# Patient Record
Sex: Female | Born: 1985 | Race: Black or African American | Hispanic: No | Marital: Single | Smoking: Never smoker
Health system: Southern US, Community
[De-identification: ages and names within clinical notes are randomized; demographics above are authoritative.]

## PROBLEM LIST (undated history)

## (undated) ENCOUNTER — Inpatient Hospital Stay: Payer: Self-pay

## (undated) DIAGNOSIS — I10 Essential (primary) hypertension: Secondary | ICD-10-CM

## (undated) DIAGNOSIS — Z9289 Personal history of other medical treatment: Secondary | ICD-10-CM

## (undated) DIAGNOSIS — O24419 Gestational diabetes mellitus in pregnancy, unspecified control: Secondary | ICD-10-CM

## (undated) DIAGNOSIS — N760 Acute vaginitis: Secondary | ICD-10-CM

## (undated) DIAGNOSIS — N915 Oligomenorrhea, unspecified: Secondary | ICD-10-CM

## (undated) DIAGNOSIS — Z789 Other specified health status: Secondary | ICD-10-CM

## (undated) DIAGNOSIS — N200 Calculus of kidney: Secondary | ICD-10-CM

## (undated) DIAGNOSIS — B9689 Other specified bacterial agents as the cause of diseases classified elsewhere: Secondary | ICD-10-CM

## (undated) HISTORY — DX: Personal history of other medical treatment: Z92.89

## (undated) HISTORY — DX: Acute vaginitis: N76.0

## (undated) HISTORY — PX: WISDOM TOOTH EXTRACTION: SHX21

## (undated) HISTORY — DX: Acute vaginitis: B96.89

## (undated) HISTORY — DX: Calculus of kidney: N20.0

## (undated) HISTORY — DX: Gestational diabetes mellitus in pregnancy, unspecified control: O24.419

## (undated) HISTORY — DX: Oligomenorrhea, unspecified: N91.5

---

## 2006-03-05 ENCOUNTER — Emergency Department: Payer: Self-pay | Admitting: Emergency Medicine

## 2007-05-27 ENCOUNTER — Emergency Department: Payer: Self-pay | Admitting: Emergency Medicine

## 2007-07-05 DIAGNOSIS — D649 Anemia, unspecified: Secondary | ICD-10-CM | POA: Insufficient documentation

## 2007-08-16 ENCOUNTER — Ambulatory Visit: Payer: Self-pay | Admitting: Family Medicine

## 2007-08-30 ENCOUNTER — Ambulatory Visit: Payer: Self-pay | Admitting: Family Medicine

## 2008-01-03 ENCOUNTER — Inpatient Hospital Stay: Payer: Self-pay | Admitting: Obstetrics and Gynecology

## 2008-03-28 ENCOUNTER — Emergency Department: Payer: Self-pay | Admitting: Emergency Medicine

## 2010-04-12 ENCOUNTER — Emergency Department: Payer: Self-pay | Admitting: Emergency Medicine

## 2011-02-25 DIAGNOSIS — Y92410 Unspecified street and highway as the place of occurrence of the external cause: Secondary | ICD-10-CM | POA: Insufficient documentation

## 2011-02-25 DIAGNOSIS — S335XXA Sprain of ligaments of lumbar spine, initial encounter: Secondary | ICD-10-CM | POA: Insufficient documentation

## 2011-02-26 ENCOUNTER — Encounter: Payer: Self-pay | Admitting: *Deleted

## 2011-02-26 ENCOUNTER — Emergency Department (HOSPITAL_BASED_OUTPATIENT_CLINIC_OR_DEPARTMENT_OTHER)
Admission: EM | Admit: 2011-02-26 | Discharge: 2011-02-26 | Disposition: A | Discharge status: Home / Self Care | Payer: No Typology Code available for payment source | Attending: Emergency Medicine | Admitting: Emergency Medicine

## 2011-02-26 DIAGNOSIS — S39012A Strain of muscle, fascia and tendon of lower back, initial encounter: Secondary | ICD-10-CM

## 2011-02-26 MED ORDER — KETOROLAC TROMETHAMINE 60 MG/2ML IM SOLN
60.0000 mg | Freq: Once | INTRAMUSCULAR | Status: AC
  Administered 2011-02-26: 60 mg via INTRAMUSCULAR
  Filled 2011-02-26: qty 2

## 2011-02-26 MED ORDER — CYCLOBENZAPRINE HCL 10 MG PO TABS: 10.0000 mg | ORAL_TABLET | Freq: Two times a day (BID) | ORAL | Status: AC | PRN

## 2011-02-26 MED ORDER — NAPROXEN 500 MG PO TABS: 500.0000 mg | ORAL_TABLET | Freq: Two times a day (BID) | ORAL | Status: AC
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
## 2012-10-11 ENCOUNTER — Emergency Department: Payer: Self-pay | Admitting: Emergency Medicine

## 2013-01-16 ENCOUNTER — Observation Stay: Payer: Self-pay | Admitting: Obstetrics and Gynecology

## 2013-02-08 ENCOUNTER — Inpatient Hospital Stay: Payer: Self-pay | Admitting: Obstetrics & Gynecology

## 2013-02-08 LAB — PIH PROFILE
Anion Gap: 6 — ABNORMAL LOW (ref 7–16)
BUN: 7 mg/dL (ref 7–18)
Calcium, Total: 8.6 mg/dL (ref 8.5–10.1)
Chloride: 106 mmol/L (ref 98–107)
Co2: 23 mmol/L (ref 21–32)
EGFR (Non-African Amer.): >60
Glucose: 96 mg/dL (ref 65–99)
MCH: 31.1 pg (ref 26.0–34.0)
MCHC: 34.2 g/dL (ref 32.0–36.0)
MCV: 91 fL (ref 80–100)
Potassium: 3.5 mmol/L (ref 3.5–5.1)
RDW: 13.9 % (ref 11.5–14.5)
Sodium: 135 mmol/L — ABNORMAL LOW (ref 136–145)
Uric Acid: 6.2 mg/dL — ABNORMAL HIGH (ref 2.6–6.0)
WBC: 11.4 10*3/uL — ABNORMAL HIGH (ref 3.6–11.0)

## 2013-02-08 LAB — PROTEIN / CREATININE RATIO, URINE
Protein, Random Urine: 7 mg/dL (ref 0–12)
Protein/Creat. Ratio: 227 mg/gCREAT — ABNORMAL HIGH (ref 0–200)

## 2013-02-09 LAB — CBC WITH DIFFERENTIAL/PLATELET
Basophil %: 0.3 %
Eosinophil #: 0 10*3/uL (ref 0.0–0.7)
HCT: 35.4 % (ref 35.0–47.0)
HGB: 12.1 g/dL (ref 12.0–16.0)
Lymphocyte %: 8.7 %
Monocyte %: 4.8 %
RBC: 3.88 10*6/uL (ref 3.80–5.20)
RDW: 13.7 % (ref 11.5–14.5)
WBC: 13.5 10*3/uL — ABNORMAL HIGH (ref 3.6–11.0)

## 2013-02-10 LAB — HEMATOCRIT: HCT: 32.7 % — ABNORMAL LOW (ref 35.0–47.0)

## 2013-02-12 LAB — PATHOLOGY REPORT

## 2013-04-23 HISTORY — PX: OTHER SURGICAL HISTORY: SHX169

## 2013-10-06 HISTORY — PX: OTHER SURGICAL HISTORY: SHX169

## 2014-05-11 ENCOUNTER — Emergency Department: Payer: Self-pay | Admitting: Emergency Medicine

## 2014-07-16 NOTE — H&P (Signed)
L&D Evaluation:  History:  HPI 29 yo G2P1001 at 253w5d seen in office yesterday with elevated BP of 150/82, negative protein dip weight up 5lbs in the last week to 193lbs, pre-pregnancy weight 149lbs.Does report headache since this morning around 11:00.  No increased edema.  +FM, no LOF, no VB, no ctx   Presents with Elevated blood pressures   Patient's Medical History No Chronic Illness   Patient's Surgical History none   Medications Pre Natal Vitamins   Allergies NKDA   Social History none   Family History Non-Contributory   ROS:  ROS All systems were reviewed.  HEENT, CNS, GI, GU, Respiratory, CV, Renal and Musculoskeletal systems were found to be normal.   Exam:  Vital Signs BP >140/90  147/84 [id number]([id number])   Urine Protein negative dipstick   General no apparent distress   Mental Status clear   Chest clear   Heart normal sinus rhythm   Abdomen gravid, non-tender   Back no CVAT   Edema no edema   Pelvic fingertip in clinic yesterday   Mebranes Intact   FHT 130, moderate, positive accels, no decels   Ucx regular, q698min   Impression:  Impression 29 yo G2P1001 at 7053w5d gestation [facility] presenting with elevated BP, GHTN vs preeclampsia   Plan:  Plan EFM/NST, monitor contractions and for cervical change, monitor BP, PIH panel   Comments 1) Gestational Hypertension at >[redacted] weeks gestation.   Per ACOG Hypertensive Disorders in Pregnancy Task Force guidlines "Hypertension In Pregnancy" Chapter 5 page 34 Nov. 2013 proceed with delivery, per task force recommendation no role of antihypertensive medications in mangment of mild range BP's      - no magnesium sulfate unless meeting criteria for preeclampsia with severe features      - Tylenol for headache 2) Fetus - category I tracing  3) PNL A pos / ABSC neg / RI / VZI / RPR NR / HIV neg / HBsAg neg / GC & CT neg & neg / 1-hr 104 / GBS negative  4) Immunization - received TDAP early pregnancy in  ED  5) History of TB treated with INH x 9 months  6) Anticipate vaginal delivery   Electronic Signatures: Lorrene ReidStaebler, Jarmal Lewelling M (MD)  (Signed 04-Dec-14 17:21)  Authored: L&D Evaluation   Last Updated: 04-Dec-14 17:21 by Lorrene ReidStaebler, Jaxie Racanelli M (MD)

## 2015-03-23 ENCOUNTER — Emergency Department
Admission: EM | Admit: 2015-03-23 | Discharge: 2015-03-23 | Disposition: A | Discharge status: Home / Self Care | Payer: BLUE CROSS/BLUE SHIELD | Attending: Emergency Medicine | Admitting: Emergency Medicine

## 2015-03-23 DIAGNOSIS — R21 Rash and other nonspecific skin eruption: Secondary | ICD-10-CM | POA: Diagnosis not present

## 2015-03-23 MED ORDER — SULFAMETHOXAZOLE-TRIMETHOPRIM 800-160 MG PO TABS: 1.0000 | ORAL_TABLET | Freq: Two times a day (BID) | ORAL | Status: DC

## 2015-03-23 MED ORDER — DIPHENHYDRAMINE HCL 25 MG PO CAPS: 25.0000 mg | ORAL_CAPSULE | Freq: Four times a day (QID) | ORAL | Status: DC | PRN

## 2015-03-23 NOTE — ED Provider Notes (Signed)
CSN: [id number]     Arrival date & time 03/23/15  1408 History   First MD Initiated Contact with Patient 03/23/15 1529     Chief Complaint  Patient presents with  . Abscess     (Consider location/radiation/quality/duration/timing/severity/associated sxs/prior Treatment) HPI  30 year old female presents to the emergency department for evaluation of skin rash. Patient has 2 erythematous areas to the right thigh 1 erythematous area to the left thigh. Symptoms have been present and noticeable for 1 day. She denies any new medications or contacts. She is uncertain if she was bit. Patient noticed itching one day ago and today after no improvement she decided to come to the emergency department. She denies any pain, body aches, fevers. Taking any new medications. She has not tried Benadryl or any other anti-itch medications.   No past medical history on file. No past surgical history on file. No family history on file. Social History  Substance Use Topics  . Smoking status: Never Smoker   . Smokeless tobacco: Not on file  . Alcohol Use: No   OB History    No data available     Review of Systems  Constitutional: Negative for fever, chills, activity change and fatigue.  HENT: Negative for congestion, sinus pressure and sore throat.   Eyes: Negative for visual disturbance.  Respiratory: Negative for cough, chest tightness and shortness of breath.   Cardiovascular: Negative for chest pain and leg swelling.  Gastrointestinal: Negative for nausea, vomiting, abdominal pain and diarrhea.  Genitourinary: Negative for dysuria.  Musculoskeletal: Negative for arthralgias and gait problem.  Skin: Positive for rash.  Neurological: Negative for weakness, numbness and headaches.  Hematological: Negative for adenopathy.  Psychiatric/Behavioral: Negative for behavioral problems, confusion and agitation.      Allergies  Review of patient's allergies indicates no known allergies.  Home  Medications   Prior to Admission medications   Medication Sig Start Date End Date Taking? Authorizing Provider  diphenhydrAMINE (BENADRYL) 25 mg capsule Take 1-2 capsules (25-50 mg total) by mouth every 6 (six) hours as needed. 03/23/15 03/22/16  Evon Slack, PA-C  sulfamethoxazole-trimethoprim (BACTRIM DS,SEPTRA DS) 800-160 MG tablet Take 1 tablet by mouth 2 (two) times daily. 03/23/15   Evon Slack, PA-C   BP 143/89 mmHg  Pulse 81  Temp(Src) 98.4 F (36.9 C)  Resp 18  Ht 5\' 3"  (1.6 m)  Wt 85.73 kg  BMI 33.49 kg/m2  SpO2 100% Physical Exam  Constitutional: She is oriented to person, place, and time. She appears well-developed and well-nourished. No distress.  HENT:  Head: Normocephalic and atraumatic.  Mouth/Throat: Oropharynx is clear and moist.  Eyes: EOM are normal. Pupils are equal, round, and reactive to light. Right eye exhibits no discharge. Left eye exhibits no discharge.  Neck: Normal range of motion. Neck supple.  Cardiovascular: Normal rate and intact distal pulses.   Pulmonary/Chest: Effort normal and breath sounds normal. No respiratory distress. She has no wheezes.  Musculoskeletal: Normal range of motion. She exhibits no edema.  Neurological: She is alert and oriented to person, place, and time. She has normal reflexes.  Skin: Skin is warm and dry.  Examination of the right anterior lateral thigh shows patient has 2 large erythematous macular rash is approximately 4 cm in diameter with no fluctuance. There is moderate induration. She is nontender to palpation. Along the left lateral proximal thigh there is a 4 cm in diameter indurated erythematous macular rash. No tenderness palpation. No fluctuance.  Psychiatric: She has a  normal mood and affect. Her behavior is normal. Thought content normal.    ED Course  Procedures (including critical care time) Labs Review Labs Reviewed - No data to display  Imaging Review No results found. I have personally reviewed  and evaluated these images and lab results as part of my medical decision-making.   EKG Interpretation None      MDM   Final diagnoses:  Skin rash    30 year old female with 1 day history of right and left lateral proximal thigh rash. Rashes aren't erythematous and indurated. There is no fluctuance. Patient is concerned that it is possible insect bite. Due to significant erythema and induration will place on antibiotics, Bactrim DS 1 tab by mouth twice a day for 7 days. She is also given Benadryl 25 mg, one to 2 tabs by mouth every 6 hours.Marland Kitchen. She'll return to the ER for any worsening symptoms urgent changes in her health.    Evon Slackhomas C Sayed Apostol, PA-C 03/23/15 1601  Governor Rooksebecca Lord, MD 03/26/15 [phone]

## 2015-03-23 NOTE — ED Notes (Signed)
Pt reports 2 days of " bites" on her bilateral outter thighs

## 2015-03-23 NOTE — ED Notes (Signed)
Pt noticed swelling on bilateral thighs yesterday and increased swelling. Pt states she thought they were spider bites but does not recall being bitten.  Pt has 2 large sized indurations on right lateral thigh and one to left lateral thigh. Pt has not taken any medications for sxs.

## 2015-03-23 NOTE — ED Notes (Signed)
NAD noted at time of D/C. Pt denies questions or concerns. Pt ambulatory to the lobby at this time.  

## 2015-03-23 NOTE — Discharge Instructions (Signed)

## 2015-12-16 LAB — HM PAP SMEAR: HM Pap smear: NEGATIVE

## 2016-03-15 ENCOUNTER — Ambulatory Visit
Admission: EM | Admit: 2016-03-15 | Discharge: 2016-03-15 | Disposition: A | Discharge status: Home / Self Care | Payer: BLUE CROSS/BLUE SHIELD | Attending: Family Medicine | Admitting: Family Medicine

## 2016-03-15 DIAGNOSIS — J069 Acute upper respiratory infection, unspecified: Secondary | ICD-10-CM | POA: Diagnosis not present

## 2016-03-15 MED ORDER — BENZONATATE 200 MG PO CAPS: 200.0000 mg | ORAL_CAPSULE | Freq: Three times a day (TID) | ORAL | 0 refills | Status: DC

## 2016-03-15 MED ORDER — FLUTICASONE PROPIONATE 50 MCG/ACT NA SUSP: 2.0000 | Freq: Every day | NASAL | 0 refills | Status: DC

## 2016-03-15 MED ORDER — HYDROCOD POLST-CPM POLST ER 10-8 MG/5ML PO SUER: 5.0000 mL | Freq: Two times a day (BID) | ORAL | 0 refills | Status: DC

## 2016-03-15 NOTE — ED Provider Notes (Signed)
CSN: [id number]     Arrival date & time 03/15/16  4098 History   First MD Initiated Contact with Patient 03/15/16 1103     Chief Complaint  Patient presents with  . Cough  . Abdominal Cramping   (Consider location/radiation/quality/duration/timing/severity/associated sxs/prior Treatment) HPI  This a 31 year old female is with a history of cough runny nose weakness body aches and chills that began 3 days ago. Her cough is productive of green sputum. She denies any fever or chills. She's also has some abdominal cramping which indicates the lower abdomen and then nausea that began yesterday. She's had no period is had no diarrhea. Her period ended 3 days ago and she is still having some mild spotting which is not unusual for her.      History reviewed. No pertinent past medical history. History reviewed. No pertinent surgical history. Family History  Problem Relation Age of Onset  . HIV Mother   . Tuberculosis Mother    Social History  Substance Use Topics  . Smoking status: Never Smoker  . Smokeless tobacco: Never Used  . Alcohol use No   OB History    No data available     Review of Systems  Constitutional: Positive for activity change and chills. Negative for fever.  HENT: Positive for congestion, postnasal drip and rhinorrhea.   Respiratory: Positive for cough. Negative for shortness of breath, wheezing and stridor.   Gastrointestinal: Positive for abdominal pain and nausea. Negative for anal bleeding, constipation, diarrhea and rectal pain.  All other systems reviewed and are negative.   Allergies  Patient has no known allergies.  Home Medications   Prior to Admission medications   Medication Sig Start Date End Date Taking? Authorizing Provider  etonogestrel-ethinyl estradiol (NUVARING) 0.12-0.015 MG/24HR vaginal ring Place 1 each vaginally every 28 (twenty-eight) days. Insert vaginally and leave in place for 3 consecutive weeks, then remove for 1 week.   Yes  Historical Provider, MD  benzonatate (TESSALON) 200 MG capsule Take 1 capsule (200 mg total) by mouth every 8 (eight) hours. 03/15/16   Lutricia Feil, PA-C  chlorpheniramine-HYDROcodone (TUSSIONEX PENNKINETIC ER) 10-8 MG/5ML SUER Take 5 mLs by mouth 2 (two) times daily. 03/15/16   Lutricia Feil, PA-C  fluticasone (FLONASE) 50 MCG/ACT nasal spray Place 2 sprays into both nostrils daily. 03/15/16   Lutricia Feil, PA-C   Meds Ordered and Administered this Visit  Medications - No data to display  BP (!) 143/93 (BP Location: Left Arm)   Pulse 68   Temp 97.9 F (36.6 C) (Oral)   Resp 18   Ht 5\' 3"  (1.6 m)   Wt 159 lb (72.1 kg)   LMP 03/08/2016   SpO2 100%   BMI 28.17 kg/m  No data found.   Physical Exam  Constitutional: She is oriented to person, place, and time. She appears well-developed and well-nourished. No distress.  HENT:  Head: Normocephalic and atraumatic.  Right Ear: External ear normal.  Left Ear: External ear normal.  Nose: Nose normal.  Mouth/Throat: Oropharynx is clear and moist.  Eyes: EOM are normal. Pupils are equal, round, and reactive to light. Right eye exhibits no discharge. Left eye exhibits no discharge.  Neck: Normal range of motion. Neck supple.  Pulmonary/Chest: Effort normal and breath sounds normal. No respiratory distress. She has no wheezes. She has no rales.  Abdominal: Soft. Bowel sounds are normal. She exhibits no distension and no mass. There is tenderness. There is no rebound and no guarding. No  hernia.  Patient has mild tenderness in the left lower quadrant in the suprapubic region.  Musculoskeletal: Normal range of motion.  Lymphadenopathy:    She has no cervical adenopathy.  Neurological: She is alert and oriented to person, place, and time.  Skin: Skin is warm and dry. She is not diaphoretic.  Psychiatric: She has a normal mood and affect. Her behavior is normal. Judgment and thought content normal.  Nursing note and vitals  reviewed.   Urgent Care Course   Clinical Course     Procedures (including critical care time)  Labs Review Labs Reviewed - No data to display  Imaging Review No results found.   Visual Acuity Review  Right Eye Distance:   Left Eye Distance:   Bilateral Distance:    Right Eye Near:   Left Eye Near:    Bilateral Near:         MDM   1. Upper respiratory tract infection, unspecified type    Discharge Medication List as of 03/15/2016 11:26 AM    START taking these medications   Details  benzonatate (TESSALON) 200 MG capsule Take 1 capsule (200 mg total) by mouth every 8 (eight) hours., Starting Mon 03/15/2016, Normal    chlorpheniramine-HYDROcodone (TUSSIONEX PENNKINETIC ER) 10-8 MG/5ML SUER Take 5 mLs by mouth 2 (two) times daily., Starting Mon 03/15/2016, Print    fluticasone (FLONASE) 50 MCG/ACT nasal spray Place 2 sprays into both nostrils daily., Starting Mon 03/15/2016, Normal      Plan: 1. Test/x-ray results and diagnosis reviewed with patient 2. rx as per orders; risks, benefits, potential side effects reviewed with patient 3. Recommend supportive treatment with Fluids and rest. I have explained to her that she has a viral illness much like her daughter had . Therefore no antibiotics are necessary to time. We will treat her symptomatically. He does not work until Friday. She works only on the weekend. She's not improving would recommend she follow-up with her primary care physician. 4. F/u prn if symptoms worsen or don't improve     Lutricia FeilWilliam P Arabelle Bollig, PA-C 03/15/16 1135

## 2016-03-15 NOTE — ED Triage Notes (Signed)
Pt c/o cough, runny nose, weakness, body aches and chills since Friday . Also abdominal cramping and nausea yesterday. Her cough is productive and green phlegm. No fever.

## 2016-03-28 ENCOUNTER — Emergency Department
Admission: EM | Admit: 2016-03-28 | Discharge: 2016-03-28 | Disposition: A | Discharge status: Home / Self Care | Payer: BLUE CROSS/BLUE SHIELD | Attending: Emergency Medicine | Admitting: Emergency Medicine

## 2016-03-28 ENCOUNTER — Emergency Department: Payer: BLUE CROSS/BLUE SHIELD

## 2016-03-28 ENCOUNTER — Encounter: Payer: Self-pay | Admitting: Emergency Medicine

## 2016-03-28 DIAGNOSIS — Y929 Unspecified place or not applicable: Secondary | ICD-10-CM | POA: Insufficient documentation

## 2016-03-28 DIAGNOSIS — W010XXA Fall on same level from slipping, tripping and stumbling without subsequent striking against object, initial encounter: Secondary | ICD-10-CM | POA: Diagnosis not present

## 2016-03-28 DIAGNOSIS — Y9302 Activity, running: Secondary | ICD-10-CM | POA: Diagnosis not present

## 2016-03-28 DIAGNOSIS — S83411A Sprain of medial collateral ligament of right knee, initial encounter: Secondary | ICD-10-CM | POA: Diagnosis not present

## 2016-03-28 DIAGNOSIS — S86811A Strain of other muscle(s) and tendon(s) at lower leg level, right leg, initial encounter: Secondary | ICD-10-CM | POA: Diagnosis not present

## 2016-03-28 DIAGNOSIS — S8991XA Unspecified injury of right lower leg, initial encounter: Secondary | ICD-10-CM | POA: Diagnosis present

## 2016-03-28 DIAGNOSIS — S86911A Strain of unspecified muscle(s) and tendon(s) at lower leg level, right leg, initial encounter: Secondary | ICD-10-CM

## 2016-03-28 DIAGNOSIS — Y999 Unspecified external cause status: Secondary | ICD-10-CM | POA: Insufficient documentation

## 2016-03-28 MED ORDER — NABUMETONE 750 MG PO TABS: 750.0000 mg | ORAL_TABLET | Freq: Two times a day (BID) | ORAL | 0 refills | Status: DC

## 2016-03-28 MED ORDER — NAPROXEN 500 MG PO TABS
500.0000 mg | ORAL_TABLET | Freq: Once | ORAL | Status: AC
  Administered 2016-03-28: 500 mg via ORAL
  Filled 2016-03-28: qty 1

## 2016-03-28 NOTE — ED Notes (Signed)
NAD noted at time of D/C. Pt denies questions or concerns. Pt ambulatory to the lobby at this time.  

## 2016-03-28 NOTE — ED Triage Notes (Signed)
Pt states was running this morning to her car, and slipped on a patch of ice. C/O right knee numbness and pain, pt states numbness from R knee to her toes. Pt states was ambulatory after the fall.

## 2016-03-28 NOTE — ED Provider Notes (Signed)
[facility] Emergency Department Provider Note ____________________________________________  Time seen: 1509  I have reviewed the triage vital signs and the nursing notes.  HISTORY  Chief Complaint  Knee Pain  HPI Vickie Hayes is a 31 y.o. female presents to the ED for evaluation of right knee pain after she slipped on a patch of ice on the way to work. She thinks the fell with the right knee bent underneath her. She reports now, after work, for pain to the medial aspect of the knee and complaints of numbness down the shin. She denies any other injury at this time.   History reviewed. No pertinent past medical history.  There are no active problems to display for this patient.  History reviewed. No pertinent surgical history.  Prior to Admission medications   Medication Sig Start Date End Date Taking? Authorizing Provider  benzonatate (TESSALON) 200 MG capsule Take 1 capsule (200 mg total) by mouth every 8 (eight) hours. 03/15/16   Lutricia Feil, PA-C  chlorpheniramine-HYDROcodone (TUSSIONEX PENNKINETIC ER) 10-8 MG/5ML SUER Take 5 mLs by mouth 2 (two) times daily. 03/15/16   Lutricia Feil, PA-C  etonogestrel-ethinyl estradiol (NUVARING) 0.12-0.015 MG/24HR vaginal ring Place 1 each vaginally every 28 (twenty-eight) days. Insert vaginally and leave in place for 3 consecutive weeks, then remove for 1 week.    Historical Provider, MD  fluticasone (FLONASE) 50 MCG/ACT nasal spray Place 2 sprays into both nostrils daily. 03/15/16   Lutricia Feil, PA-C  nabumetone (RELAFEN) 750 MG tablet Take 1 tablet (750 mg total) by mouth 2 (two) times daily. 03/28/16   Charlesetta Ivory Nitesh Pitstick, PA-C   Allergies Patient has no known allergies.  Family History  Problem Relation Age of Onset  . HIV Mother   . Tuberculosis Mother     Social History Social History  Substance Use Topics  . Smoking status: Never Smoker  . Smokeless tobacco: Never Used  . Alcohol use No     Review of Systems  Constitutional: Negative for fever. Musculoskeletal: Negative for back pain. Right knee pain as above Skin: Negative for rash. Neurological: Negative for headaches, focal weakness or numbness. ____________________________________________  PHYSICAL EXAM:  VITAL SIGNS: ED Triage Vitals [03/28/16 1450]  Enc Vitals Group     BP 128/84     Pulse Rate 88     Resp 18     Temp 98.2 F (36.8 C)     Temp Source Oral     SpO2 99 %     Weight 159 lb (72.1 kg)     Height 5\' 3"  (1.6 m)     Head Circumference      Peak Flow      Pain Score 5     Pain Loc      Pain Edu?      Excl. in GC?     Constitutional: Alert and oriented. Well appearing and in no distress. Head: Normocephalic and atraumatic. Cardiovascular:  Normal distal pulses. Musculoskeletal: Right knee without obvious deformity, dislocation, or effusion. Normal ROM without laxity. Normal patella tracking without ballotment. No calf or achilles tenderness. Minimally tender to the medial joint line. No valgus or varus joint stress. Negative drawer. Nontender with normal range of motion in all extremities.  Neurologic:  Mildly antalgic gait without ataxia. Normal speech and language. No gross focal neurologic deficits are appreciated. Skin:  Skin is warm, dry and intact. No rash noted. ____________________________________________   RADIOLOGY  Right Knee  IMPRESSION: Negative. ____________________________________________  PROCEDURES  Knee immobilizer ____________________________________________  INITIAL IMPRESSION / ASSESSMENT AND PLAN / ED COURSE  Patient with a negative x-ray and a exam which may show a mild medial collat ligament strain. She is placed in a knee immobilizer for support. [facility]. A work note for one day is provided.  ____________________________________________  FINAL CLINICAL IMPRESSION(S) / ED DIAGNOSES  Final diagnoses:  Strain of  right knee, initial encounter  Sprain of medial collateral ligament of right knee, initial encounter     Lissa HoardJenise V Bacon Aubrei Bouchie, PA-C 03/28/16 2352    Arnaldo NatalPaul F Malinda, MD 03/29/16 [phone]

## 2016-03-28 NOTE — Discharge Instructions (Signed)
Wear the knee immobilizer for support. Rest with the leg elevated for comfort. Apply ice to reduce any pain and swelling. Follow-up with Dr. Ernest PineHooten for continued symptoms. Take the prescription pain medicine as directed.

## 2016-05-06 ENCOUNTER — Telehealth: Payer: Self-pay | Admitting: Obstetrics and Gynecology

## 2016-05-06 NOTE — Telephone Encounter (Signed)
-----   Message from Vena AustriaAndreas Staebler, MD sent at 05/05/2016  3:38 PM EST ----- Regarding: Surgery Surgery Date: In the next 1-6 weeks  LOS: outpatient  Primary Diagnosis: Desires surgical sterilization  Secondary Diagnosis: N/A  Procedure: Laparoscopic BTL (Falope rings)  Equipment: falope ring applicator  Length of Surgery 1-hr

## 2016-05-06 NOTE — Telephone Encounter (Signed)
Patient scheduled for H&P on 06/07/16 and OR on 06/10/16. Patient was given my phone# and ext.

## 2016-06-03 ENCOUNTER — Telehealth: Payer: Self-pay | Admitting: Obstetrics and Gynecology

## 2016-06-03 NOTE — Telephone Encounter (Signed)
Pt called this Morning to cancel her H&P with Dr. Bonney AidStaebler for 06/07/16. Pt is also schedule for Surgery that day and want to postpone due to having second thoughts about having surgery.

## 2016-06-03 NOTE — Telephone Encounter (Signed)
Note routed to WebberNancy ([facility] manageroffice scheduler)

## 2016-06-03 NOTE — Telephone Encounter (Signed)
H&P, Pre-Admit, and OR cancelled.

## 2016-06-07 ENCOUNTER — Ambulatory Visit: Payer: Self-pay | Admitting: Obstetrics and Gynecology

## 2016-06-07 ENCOUNTER — Inpatient Hospital Stay: Admission: RE | Admit: 2016-06-07 | Payer: BLUE CROSS/BLUE SHIELD | Source: Ambulatory Visit

## 2016-06-10 ENCOUNTER — Ambulatory Visit: Admit: 2016-06-10 | Payer: BLUE CROSS/BLUE SHIELD | Admitting: Obstetrics and Gynecology

## 2016-06-10 SURGERY — LIGATION, FALLOPIAN TUBE, LAPAROSCOPIC
Anesthesia: Choice | Laterality: Bilateral

## 2016-06-15 ENCOUNTER — Ambulatory Visit (INDEPENDENT_AMBULATORY_CARE_PROVIDER_SITE_OTHER): Payer: BLUE CROSS/BLUE SHIELD | Admitting: Obstetrics and Gynecology

## 2016-06-15 ENCOUNTER — Encounter: Payer: Self-pay | Admitting: Obstetrics and Gynecology

## 2016-06-15 VITALS — BP 128/76 | HR 85 | Ht 63.0 in | Wt 169.0 lb

## 2016-06-15 DIAGNOSIS — N760 Acute vaginitis: Secondary | ICD-10-CM | POA: Diagnosis not present

## 2016-06-15 DIAGNOSIS — N926 Irregular menstruation, unspecified: Secondary | ICD-10-CM | POA: Diagnosis not present

## 2016-06-15 DIAGNOSIS — N898 Other specified noninflammatory disorders of vagina: Secondary | ICD-10-CM | POA: Diagnosis not present

## 2016-06-15 DIAGNOSIS — B9689 Other specified bacterial agents as the cause of diseases classified elsewhere: Secondary | ICD-10-CM

## 2016-06-15 DIAGNOSIS — Z3009 Encounter for other general counseling and advice on contraception: Secondary | ICD-10-CM

## 2016-06-15 LAB — POCT WET PREP WITH KOH
CLUE CELLS WET PREP PER HPF POC: POSITIVE
KOH PREP POC: POSITIVE — AB
Trichomonas, UA: NEGATIVE
Yeast Wet Prep HPF POC: NEGATIVE

## 2016-06-15 LAB — POCT URINE PREGNANCY: Preg Test, Ur: NEGATIVE

## 2016-06-15 MED ORDER — CLINDAMYCIN PHOSPHATE 2 % VA CREA: 1.0000 | TOPICAL_CREAM | Freq: Every day | VAGINAL | 0 refills | Status: DC

## 2016-06-15 NOTE — Progress Notes (Signed)
Chief Complaint  Patient presents with  . Gynecologic Exam    bloody discharge/odor x 1 week     HPI:      Ms. Vickie Hayes is a 31 y.o. No obstetric history on file. who LMP was Patient's last menstrual period was 06/06/2016., presents today for a problem visit.  Sex activity: single partner, contraception - condoms most of the time. She uses dove sens skin soap/no dryer sheets.  She complains of increased d/c that is pink/bloody for the past wk with an odor, no itching. She has a hx of recurrent BV and has been treated with flagyl, clindamycin, and tindamax. Sx cont to recur. She tried tindamax before and after her period and sex as preventive without relief. She denies any new sex parnters. She was on nuvaring and was going to have BTL, but cancelled that surg. She has had irreg menses since stopping nuvaring.  LMP 06/04/16, bleeding for 2 days, spotting a few days before.   No LBP, fevers, belly pain, urin sx.   Review of Systems  Constitutional: Negative for fever.  Gastrointestinal: Negative for blood in stool, constipation, diarrhea, nausea and vomiting.  Genitourinary: Positive for vaginal bleeding and vaginal discharge. Negative for dyspareunia, dysuria, flank pain, frequency, hematuria, urgency and vaginal pain.  Musculoskeletal: Negative for back pain.  Skin: Negative for rash.    History reviewed. No pertinent past medical history.  Family History  Problem Relation Age of Onset  . HIV Mother   . Tuberculosis Mother     Social History   Social History  . Marital status: Single    Spouse name: N/A  . Number of children: N/A  . Years of education: N/A   Occupational History  . Not on file.   Social History Main Topics  . Smoking status: Never Smoker  . Smokeless tobacco: Never Used  . Alcohol use No  . Drug use: No  . Sexual activity: Yes    Birth control/ protection: Condom   Other Topics Concern  . Not on file   Social History Narrative  . No  narrative on file     Current Outpatient Prescriptions:  .  ibuprofen (ADVIL,MOTRIN) 200 MG tablet, Take 600-800 mg by mouth every 6 (six) hours as needed for headache., Disp: , Rfl:  .  clindamycin (CLEOCIN) 2 % vaginal cream, Place 1 Applicatorful vaginally at bedtime., Disp: 40 g, Rfl: 0 .  tinidazole (TINDAMAX) 500 MG tablet, Take 500 mg by mouth daily as needed., Disp: , Rfl:   OBJECTIVE:   Vitals:  BP 128/76 (BP Location: Left Arm, Patient Position: Sitting, Cuff Size: Normal)   Pulse 85   Ht  (1.6 m)   Wt 169 lb (76.7 kg)   LMP 06/06/2016   BMI 29.94 kg/m   Physical Exam  Constitutional: She is oriented to person, place, and time and well-developed, well-nourished, and in no distress. Vital signs are normal.  Genitourinary: Uterus normal, cervix normal, right adnexa normal, left adnexa normal and vulva normal. Uterus is not enlarged. Cervix exhibits no motion tenderness and no tenderness. Right adnexum displays no mass and no tenderness. Left adnexum displays no mass and no tenderness. Vulva exhibits no erythema, no exudate, no lesion, no rash and no tenderness. Vagina exhibits exudate. Vagina exhibits no lesion. Thin  bloody  odorless  red and vaginal discharge found.  Neurological: She is oriented to person, place, and time.  Vitals reviewed.   Results: Results for orders placed or performed  in visit on 06/15/16 (from the past 24 hour(s))  POCT urine pregnancy     Status: Normal   Collection Time: 06/15/16  5:10 PM  Result Value Ref Range   Preg Test, Ur Negative Negative  POCT Wet Prep with KOH     Status: Abnormal   Collection Time: 06/15/16  5:16 PM  Result Value Ref Range   Trichomonas, UA Negative    Clue Cells Wet Prep HPF POC pos    Epithelial Wet Prep HPF POC  Few, Moderate, Many, Too numerous to count   Yeast Wet Prep HPF POC neg    Bacteria Wet Prep HPF POC  Few   RBC Wet Prep HPF POC     WBC Wet Prep HPF POC     KOH Prep POC Positive (A) Negative      Assessment/Plan: Bacterial vaginosis - Recurrent. Rx clindamycin. Check one swab AV and BV. Will then try maintenance tx. Will call pt wiht results. - Plan: clindamycin (CLEOCIN) 2 % vaginal cream, Other/Misc lab test, POCT Wet Prep with KOH  Vaginal discharge - Plan: clindamycin (CLEOCIN) 2 % vaginal cream, Other/Misc lab test, POCT Wet Prep with KOH  Irregular menses - Off nuvaring. Neg UPT. Reassurance.  - Plan: POCT urine pregnancy  Encounter for other general counseling or advice on contraception - Use condoms every time.      Meds ordered this encounter  Medications  . clindamycin (CLEOCIN) 2 % vaginal cream    Sig: Place 1 Applicatorful vaginally at bedtime.    Dispense:  40 g    Refill:  0      Return if symptoms worsen or fail to improve.  Alicia B. Copland, PA-C 06/15/2016 5:18 PM

## 2016-06-21 ENCOUNTER — Telehealth: Payer: Self-pay | Admitting: Obstetrics and Gynecology

## 2016-06-21 DIAGNOSIS — N760 Acute vaginitis: Principal | ICD-10-CM

## 2016-06-21 DIAGNOSIS — N898 Other specified noninflammatory disorders of vagina: Secondary | ICD-10-CM

## 2016-06-21 DIAGNOSIS — B9689 Other specified bacterial agents as the cause of diseases classified elsewhere: Secondary | ICD-10-CM | POA: Insufficient documentation

## 2016-06-21 MED ORDER — AMPICILLIN 500 MG PO CAPS: 500.0000 mg | ORAL_CAPSULE | Freq: Four times a day (QID) | ORAL | 0 refills | Status: AC

## 2016-06-21 MED ORDER — CLINDAMYCIN PHOSPHATE 2 % VA CREA: 1.0000 | TOPICAL_CREAM | Freq: Every day | VAGINAL | 1 refills | Status: DC

## 2016-06-21 NOTE — Telephone Encounter (Signed)
Pt aware of BV and AV on One Swab (atopobium and enterococcus faecalis). Pt on clindamycin with sx improvement. Rx ampicillin 500 mg QID eRxd for AV. Rx RF clindamycin for once weekly treatment as maintenance therapy due to recurrent BV sx. F/u prn.

## 2016-07-15 ENCOUNTER — Encounter: Payer: Self-pay | Admitting: Obstetrics and Gynecology

## 2016-07-19 ENCOUNTER — Encounter: Payer: Self-pay | Admitting: Obstetrics and Gynecology

## 2016-07-28 ENCOUNTER — Ambulatory Visit: Payer: BLUE CROSS/BLUE SHIELD | Admitting: Obstetrics and Gynecology

## 2016-08-05 ENCOUNTER — Ambulatory Visit: Payer: BLUE CROSS/BLUE SHIELD | Admitting: Obstetrics and Gynecology

## 2016-08-09 ENCOUNTER — Other Ambulatory Visit: Payer: Self-pay | Admitting: Otolaryngology

## 2016-08-09 DIAGNOSIS — R221 Localized swelling, mass and lump, neck: Secondary | ICD-10-CM

## 2016-08-17 ENCOUNTER — Encounter: Payer: Self-pay | Admitting: *Deleted

## 2016-08-17 ENCOUNTER — Emergency Department
Admission: EM | Admit: 2016-08-17 | Discharge: 2016-08-18 | Disposition: A | Discharge status: Home / Self Care | Payer: BLUE CROSS/BLUE SHIELD | Attending: Emergency Medicine | Admitting: Emergency Medicine

## 2016-08-17 ENCOUNTER — Emergency Department: Payer: BLUE CROSS/BLUE SHIELD

## 2016-08-17 DIAGNOSIS — Z3A00 Weeks of gestation of pregnancy not specified: Secondary | ICD-10-CM | POA: Diagnosis not present

## 2016-08-17 DIAGNOSIS — N939 Abnormal uterine and vaginal bleeding, unspecified: Secondary | ICD-10-CM | POA: Diagnosis present

## 2016-08-17 DIAGNOSIS — O200 Threatened abortion: Secondary | ICD-10-CM

## 2016-08-17 LAB — CBC WITH DIFFERENTIAL/PLATELET
BASOS ABS: 0 10*3/uL (ref 0–0.1)
Basophils Relative: 1 %
Eosinophils Absolute: 0.1 10*3/uL (ref 0–0.7)
Eosinophils Relative: 2 %
HEMATOCRIT: 35.6 % (ref 35.0–47.0)
Hemoglobin: 12.6 g/dL (ref 12.0–16.0)
LYMPHS PCT: 35 %
Lymphs Abs: 2.4 10*3/uL (ref 1.0–3.6)
MCH: 31.7 pg (ref 26.0–34.0)
MCHC: 35.5 g/dL (ref 32.0–36.0)
MCV: 89.4 fL (ref 80.0–100.0)
MONO ABS: 0.5 10*3/uL (ref 0.2–0.9)
Monocytes Relative: 8 %
NEUTROS ABS: 3.8 10*3/uL (ref 1.4–6.5)
Neutrophils Relative %: 56 %
Platelets: 157 10*3/uL (ref 150–440)
RBC: 3.98 MIL/uL (ref 3.80–5.20)
RDW: 13.2 % (ref 11.5–14.5)
WBC: 6.9 10*3/uL (ref 3.6–11.0)

## 2016-08-17 LAB — URINALYSIS, COMPLETE (UACMP) WITH MICROSCOPIC
BILIRUBIN URINE: NEGATIVE
Glucose, UA: NEGATIVE mg/dL
KETONES UR: NEGATIVE mg/dL
LEUKOCYTES UA: NEGATIVE
Nitrite: NEGATIVE
Protein, ur: NEGATIVE mg/dL
Specific Gravity, Urine: 1.008 (ref 1.005–1.030)
pH: 6 (ref 5.0–8.0)

## 2016-08-17 LAB — BASIC METABOLIC PANEL
ANION GAP: 7 (ref 5–15)
BUN: 10 mg/dL (ref 6–20)
CO2: 25 mmol/L (ref 22–32)
Calcium: 9.6 mg/dL (ref 8.9–10.3)
Chloride: 102 mmol/L (ref 101–111)
Creatinine, Ser: 0.54 mg/dL (ref 0.44–1.00)
GFR calc Af Amer: >60 mL/min (ref >60)
GFR calc non Af Amer: >60 mL/min (ref >60)
GLUCOSE: 108 mg/dL — AB (ref 65–99)
POTASSIUM: 3.5 mmol/L (ref 3.5–5.1)
Sodium: 134 mmol/L — ABNORMAL LOW (ref 135–145)

## 2016-08-17 LAB — ABO/RH: ABO/RH(D): A POS

## 2016-08-17 LAB — POCT PREGNANCY, URINE
PREG TEST UR: NEGATIVE
Preg Test, Ur: POSITIVE — AB

## 2016-08-17 LAB — HCG, QUANTITATIVE, PREGNANCY: HCG, BETA CHAIN, QUANT, S: 305 m[IU]/mL — AB (ref <5)

## 2016-08-17 NOTE — ED Provider Notes (Signed)
[facility] Emergency Department Provider Note  ____________________________________________   First MD Initiated Contact with Patient 08/17/16 2206     (approximate)  I have reviewed the triage vital signs and the nursing notes.   HISTORY  Chief Complaint Vaginal Bleeding    HPI Glorimar Francis Dowse Arment is a 31 y.o. female G3P2 with a recent + home pregnancy test.  She presents for evaluation of about a week of vaginal spotting with increased quantity of bleeding over the last couple of days.  Her period is irregular so she is not certain when was her last one.  She had unprotected intercourse about 2 weeks ago and checked a pregnancy test yesterday and reports that it was positive.  She feels that it would need to be a very early pregnancy.  She came in today because of mild to moderate intermittent lower abdominal cramping that radiates to her back and the increased bleeding which she describes as a trickle of blood when she urinates or sits on the toilet.  She is concerned she may be having a miscarriage.She denies fever/chills, chest pain, shortness of breath, nausea, vomiting, dysuria.  Nothing in particular makes the patient's symptoms better nor worse.     Past Medical History:  Diagnosis Date  . Bacterial vaginosis   . History of Papanicolaou smear of cervix 03/23/2013;10102017   NEG; NEG  . Oligomenorrhea    OFF BC    Patient Active Problem List   Diagnosis Date Noted  . BV (bacterial vaginosis) 06/21/2016  . AV (anaerobic vaginosis) 06/21/2016    Past Surgical History:  Procedure Laterality Date  . NEXPLANON INSERTION  04/23/2013  . NEXPLANON REMOVAL  10/2013   [facility]    Prior to Admission medications   Medication Sig Start Date End Date Taking? Authorizing Provider  clindamycin (CLEOCIN) 2 % vaginal cream Place 1 Applicatorful vaginally at bedtime. Once weekly as maintenance for 3 months 06/21/16   Copland, Ilona SorrelAlicia B, PA-C    etonogestrel-ethinyl estradiol (NUVARING) 0.12-0.015 MG/24HR vaginal ring Place 1 each vaginally every 28 (twenty-eight) days. Insert vaginally and leave in place for 3 consecutive weeks, then remove for 1 week.    [provider]  ibuprofen (ADVIL,MOTRIN) 200 MG tablet Take 600-800 mg by mouth every 6 (six) hours as needed for headache.    [provider]  tinidazole (TINDAMAX) 500 MG tablet Take 500 mg by mouth daily as needed.    [provider]    Allergies Patient has no known allergies.  Family History  Problem Relation Age of Onset  . HIV Mother   . Tuberculosis Mother   . Hypertension Mother   . Cancer Maternal Grandmother   . Hypertension Maternal Grandmother     Social History Social History  Substance Use Topics  . Smoking status: Never Smoker  . Smokeless tobacco: Never Used  . Alcohol use No    Review of Systems Constitutional: No fever/chills Eyes: No visual changes. ENT: No sore throat. Cardiovascular: Denies chest pain. Respiratory: Denies shortness of breath. Gastrointestinal: Mild to moderate intermittent lower abdominal cramping radiating to her back.  Denies nausea/vomiting/diarrhea Genitourinary: Vaginal spotting for about a week with increased vaginal bleeding over the last couple of days.  Negative for dysuria. Musculoskeletal: Negative for neck pain.  Some lower abdominal cramping radiating through to her back Integumentary: Negative for rash. Neurological: Negative for headaches, focal weakness or numbness.   ____________________________________________   PHYSICAL EXAM:  VITAL SIGNS: ED Triage Vitals  Enc  Vitals Group     BP 08/17/16 2034 140/85     Pulse Rate 08/17/16 2034 84     Resp 08/17/16 2034 20     Temp 08/17/16 2034 98.9 F (37.2 C)     Temp Source 08/17/16 2034 Oral     SpO2 08/17/16 2034 99 %     Weight 08/17/16 2035 77.6 kg (171 lb)     Height 08/17/16 2035 1.6 m (5\' 3" )     Head Circumference --       Peak Flow --      Pain Score 08/17/16 2033 5     Pain Loc --      Pain Edu? --      Excl. in GC? --     Constitutional: Alert and oriented. Well appearing and in no acute distress. Eyes: Conjunctivae are normal.  Head: Atraumatic. Nose: No congestion/rhinnorhea. Mouth/Throat: Mucous membranes are moist. Neck: No stridor.  No meningeal signs.   Cardiovascular: Normal rate, regular rhythm. Good peripheral circulation. Grossly normal heart sounds. Respiratory: Normal respiratory effort.  No retractions. Lungs CTAB. Gastrointestinal: Soft With diffuse lower abdominal tenderness, no peritonitis. GU:  deferred at patient preference and request Musculoskeletal: No lower extremity tenderness nor edema. No gross deformities of extremities. Neurologic:  Normal speech and language. No gross focal neurologic deficits are appreciated.  Skin:  Skin is warm, dry and intact. No rash noted. Psychiatric: Mood and affect are normal. Speech and behavior are normal.  ____________________________________________   LABS (all labs ordered are listed, but only abnormal results are displayed)  Labs Reviewed  BASIC METABOLIC PANEL - Abnormal; Notable for the following:       Result Value   Sodium 134 (*)    Glucose, Bld 108 (*)    All other components within normal limits  URINALYSIS, COMPLETE (UACMP) WITH MICROSCOPIC - Abnormal; Notable for the following:    Color, Urine YELLOW (*)    APPearance CLEAR (*)    Hgb urine dipstick MODERATE (*)    Bacteria, UA RARE (*)    Squamous Epithelial / LPF 0-5 (*)    All other components within normal limits  HCG, QUANTITATIVE, PREGNANCY - Abnormal; Notable for the following:    hCG, Beta Chain, Quant, S 305 (*)    All other components within normal limits  POCT PREGNANCY, URINE - Abnormal; Notable for the following:    Preg Test, Ur POSITIVE (*)    All other components within normal limits  CBC WITH DIFFERENTIAL/PLATELET  POC URINE PREG, ED  POCT  PREGNANCY, URINE  ABO/RH   ____________________________________________  EKG  None - EKG not ordered by ED physician ____________________________________________  RADIOLOGY   No results found.  ____________________________________________   PROCEDURES  Critical Care performed: No   Procedure(s) performed:   Procedures   ____________________________________________   INITIAL IMPRESSION / ASSESSMENT AND PLAN / ED COURSE  Pertinent labs & imaging results that were available during my care of the patient were reviewed by me and considered in my medical decision making (see chart for details).  The patient has a very low hCG of about 300.  I explained to her that the pregnancy may be too early to be certain of the diagnosis on the ultrasound but that we would obtain an ultrasound to evaluate for possible ectopic pregnancy or pregnancy that was further along than she anticipated but for which she is having a spontaneous abortion.  I discussed with her the fact that a pelvic exam is a  usual and customary part of the workup for this or complaint, but also admitted that frequently it is nondiagnostic.  She feels strongly that she does not want to have the exam elicited is absolutely necessary based on the ultrasound findings.  I discussed this with her on 2 separate occasions and she remains firm that she does not want exam so we will defer at this time.  The patient is Rh+ and therefore does not need RhoGAM.  She has been to [facility] in the past so I will appear some discharge paperwork with follow-up information.  At this time the ultrasound results are pending.  I am transferring the care to Dr. Manson Passey..      ____________________________________________  FINAL CLINICAL IMPRESSION(S) / ED DIAGNOSES  Final diagnoses:  Threatened miscarriage     MEDICATIONS GIVEN DURING THIS VISIT:  Medications - No data to display   NEW OUTPATIENT MEDICATIONS STARTED DURING THIS  VISIT:  New Prescriptions   No medications on file    Modified Medications   No medications on file    Discontinued Medications   No medications on file     Note:  This document was prepared using Dragon voice recognition software and may include unintentional dictation errors.    Loleta Rose, MD 08/17/16 2303

## 2016-08-17 NOTE — ED Triage Notes (Signed)
Pt reports positive home pregnancy test yesterday.  Pt has vag bleeding for 3-4 days.  Pt also has abd cramping and back pain.  Pt has urinary pressure.  Pt alert.

## 2016-08-18 ENCOUNTER — Ambulatory Visit: Payer: BLUE CROSS/BLUE SHIELD

## 2016-08-18 NOTE — ED Provider Notes (Signed)
I assumed care of the patient from Dr. York CeriseForbach 11:30 PM with ultrasound pending at that time. Ultrasound revealed: CLINICAL DATA:  Vaginal bleeding  EXAM: OBSTETRIC <14 WK US AND TRANSVAGINAL OB US  TECHNIQUE: Both transabdominal and transvaginal ultrasound examinations were performed for complete evaluation of the gestation as well as the maternal uterus, adnexal regions, and pelvic cul-de-sac. Transvaginal technique was performed to assess early pregnancy.  COMPARISON:  None.  FINDINGS: Intrauterine gestational sac: Not visualized  Yolk sac:  Not visualized  Embryo:  Not visualized  Maternal uterus/adnexae: Thickened endometrial echoes up to 2.9 cm. Right ovary is within normal limits and measures 3 x 1.9 x 1.9 cm. The left ovary measures 3.9 x 2.4 by 2.3 cm and contains probable hemorrhagic luteal cysts. Trace free fluid in the cul-de-sac.  IMPRESSION: 1. No intrauterine pregnancy is visualized. Findings consistent with pregnancy of unknown location, differential considerations include intrauterine pregnancy too early to visualize, occult ectopic, and recent miscarriage. Correlation with serial HCG recommended with repeat ultrasound as clinically appropriate.   Electronically Signed   By: Jasmine PangKim  Fujinaga M.D.   On: 08/17/2016 23:31   I spoke patient at length regarding the need to follow-up with OB/GYN which she stated that [facility]. I  reiterated with the patient what was told by Dr. Clarita LeberFrobach that this could be a threatened miscarriage. Of note the patient's blood type is A+   Darci CurrentBrown, [place] N, MD 08/18/16 [phone]

## 2016-08-30 ENCOUNTER — Ambulatory Visit: Admission: RE | Admit: 2016-08-30 | Payer: BLUE CROSS/BLUE SHIELD | Source: Ambulatory Visit

## 2016-09-16 DIAGNOSIS — O0993 Supervision of high risk pregnancy, unspecified, third trimester: Secondary | ICD-10-CM | POA: Insufficient documentation

## 2016-09-16 LAB — OB RESULTS CONSOLE ANTIBODY SCREEN: ANTIBODY SCREEN: NEGATIVE

## 2016-09-16 LAB — OB RESULTS CONSOLE RUBELLA ANTIBODY, IGM: Rubella: IMMUNE

## 2016-09-16 LAB — OB RESULTS CONSOLE VARICELLA ZOSTER ANTIBODY, IGG: Varicella: IMMUNE

## 2016-09-16 LAB — OB RESULTS CONSOLE HIV ANTIBODY (ROUTINE TESTING): HIV: NONREACTIVE

## 2016-09-16 LAB — OB RESULTS CONSOLE HEPATITIS B SURFACE ANTIGEN: HEP B S AG: NEGATIVE

## 2016-10-17 ENCOUNTER — Ambulatory Visit
Admission: EM | Admit: 2016-10-17 | Discharge: 2016-10-17 | Disposition: A | Discharge status: Home / Self Care | Payer: BLUE CROSS/BLUE SHIELD | Attending: Family Medicine | Admitting: Family Medicine

## 2016-10-17 ENCOUNTER — Encounter: Payer: Self-pay | Admitting: Emergency Medicine

## 2016-10-17 DIAGNOSIS — N898 Other specified noninflammatory disorders of vagina: Secondary | ICD-10-CM

## 2016-10-17 DIAGNOSIS — R102 Pelvic and perineal pain: Secondary | ICD-10-CM

## 2016-10-17 DIAGNOSIS — R1030 Lower abdominal pain, unspecified: Secondary | ICD-10-CM | POA: Diagnosis not present

## 2016-10-17 DIAGNOSIS — Z331 Pregnant state, incidental: Secondary | ICD-10-CM

## 2016-10-17 DIAGNOSIS — O26892 Other specified pregnancy related conditions, second trimester: Secondary | ICD-10-CM

## 2016-10-17 NOTE — ED Triage Notes (Addendum)
As per patient has sharp pain lower central abdominal pain pressure and lower back pain onset today obtw pt is [redacted] week pregnant feels like intermittent pain from this morning.

## 2016-10-17 NOTE — ED Provider Notes (Signed)
[facility]    CSN: [id number] Arrival date & time: 10/17/16  1103     History   Chief Complaint Chief Complaint  Patient presents with  . Abdominal Pain   HPI  31 year old female who is currently pregnant [redacted] weeks pregnant per OB presents with complaints of lower abdominal pain.  Patient reports that she has had sharp intermittent genital/suprapubic pain since 6 AM this morning. No bleeding. No gush of fluid. She does note some vaginal discharge. She states that she has recently lifted something at work but does not recall strain or injury. No urinary symptoms. No nausea or vomiting. No known exacerbating or relieving factors. No other associated symptoms. No other complaints or concerns at this time.  Past Medical History:  Diagnosis Date  . Bacterial vaginosis   . History of Papanicolaou smear of cervix 03/23/2013;10102017   NEG; NEG  . Oligomenorrhea    OFF BC   Patient Active Problem List   Diagnosis Date Noted  . BV (bacterial vaginosis) 06/21/2016  . AV (anaerobic vaginosis) 06/21/2016   Past Surgical History:  Procedure Laterality Date  . NEXPLANON INSERTION  04/23/2013  . NEXPLANON REMOVAL  10/2013   [facility] D[facility]W   OB History    Gravida Para Term Preterm AB Living   3 2 2     2    SAB TAB Ectopic Multiple Live Births         1 2     Home Medications    Prior to Admission medications   Medication Sig Start Date End Date Taking? Authorizing Provider  clindamycin (CLEOCIN) 2 % vaginal cream Place 1 Applicatorful vaginally at bedtime. Once weekly as maintenance for 3 months 06/21/16   Copland, Ilona Sorrel, PA-C  etonogestrel-ethinyl estradiol (NUVARING) 0.12-0.015 MG/24HR vaginal ring Place 1 each vaginally every 28 (twenty-eight) days. Insert vaginally and leave in place for 3 consecutive weeks, then remove for 1 week.    [provider]  ibuprofen (ADVIL,MOTRIN) 200 MG tablet Take 600-800 mg by mouth every 6 (six) hours as needed for  headache.    [provider]  tinidazole (TINDAMAX) 500 MG tablet Take 500 mg by mouth daily as needed.    [provider]    Family History Family History  Problem Relation Age of Onset  . HIV Mother   . Tuberculosis Mother   . Hypertension Mother   . Cancer Maternal Grandmother   . Hypertension Maternal Grandmother     Social History Social History  Substance Use Topics  . Smoking status: Never Smoker  . Smokeless tobacco: Never Used  . Alcohol use No     Allergies   Patient has no known allergies.   Review of Systems Review of Systems  Gastrointestinal: Positive for abdominal pain.  Genitourinary: Negative.    Physical Exam Triage Vital Signs ED Triage Vitals  Enc Vitals Group     BP 10/17/16 1120 129/76     Pulse Rate 10/17/16 1120 (!) 103     Resp 10/17/16 1120 16     Temp 10/17/16 1120 98.4 F (36.9 C)     Temp Source 10/17/16 1120 Oral     SpO2 10/17/16 1120 100 %     Weight 10/17/16 1117 169 lb (76.7 kg)     Height 10/17/16 1117 5\' 3"  (1.6 m)     Head Circumference --      Peak Flow --      Pain Score 10/17/16 1118 8  Pain Loc --      Pain Edu? --      Excl. in GC? --    Updated Vital Signs BP 129/76 (BP Location: Right Arm)   Pulse (!) 103   Temp 98.4 F (36.9 C) (Oral)   Resp 16   Ht 5\' 3"  (1.6 m)   Wt 169 lb (76.7 kg)   LMP 08/13/2016 (Approximate)   SpO2 100%   BMI 29.94 kg/m   Physical Exam  Constitutional: She is oriented to person, place, and time. She appears well-developed. No distress.  Cardiovascular: Normal rate and regular rhythm.   Pulmonary/Chest: Effort normal. No respiratory distress. She has no wheezes. She has no rales.  Abdominal: Soft.  Patient reports tenderness to palpation in the suprapubic region.  Genitourinary:  Genitourinary Comments: Fundal height noted approximately 2-3 fingerbreadths below the umbilicus. Fetal heart rate 160s.  Neurological: She is alert and oriented to person, place,  and time.  Psychiatric: She has a normal mood and affect.  Vitals reviewed.  UC Treatments / Results  Labs (all labs ordered are listed, but only abnormal results are displayed) Labs Reviewed - No data to display  EKG  EKG Interpretation None       Radiology No results found.  Procedures Procedures (including critical care time)  Medications Ordered in UC Medications - No data to display   Initial Impression / Assessment and Plan / UC Course  I have reviewed the triage vital signs and the nursing notes.  Pertinent labs & imaging results that were available during my care of the patient were reviewed by me and considered in my medical decision making (see chart for details).   31 year old female who is reportedly [redacted] weeks pregnant presents with suprapubic pain. No urinary symptoms to suggest UTI. No vaginal bleeding or other red flags. I feel that she is further along in her pregnancy based on her physical exam. We had a lengthy discussion about further workup and evaluation. I urged her to go to the ER or to call her OB who is on-call to discuss further evaluation. I feel like she made an ultrasound or continuous fetal monitoring. Patient states that she will call her OB and have him direct her care from here.  Final Clinical Impressions(s) / UC Diagnoses   Final diagnoses:  Pelvic pain affecting pregnancy in second trimester, antepartum    New Prescriptions Discharge Medication List as of 10/17/2016 12:27 PM       Controlled Substance Prescriptions [place] Controlled Substance Registry consulted? No   Tommie SamsCook, Deaire Mcwhirter G, [place] 10/17/16 1255

## 2016-10-17 NOTE — Discharge Instructions (Signed)
Call the on call OB and have them direct you.  HR of the baby - 160's and reassuring.   Take care  Dr. Adriana Simasook

## 2016-10-30 LAB — OB RESULTS CONSOLE GC/CHLAMYDIA
Chlamydia: NEGATIVE
Gonorrhea: NEGATIVE

## 2017-01-13 ENCOUNTER — Observation Stay
Admission: EM | Admit: 2017-01-13 | Discharge: 2017-01-13 | Disposition: A | Discharge status: Home / Self Care | Payer: BLUE CROSS/BLUE SHIELD | Attending: Obstetrics and Gynecology | Admitting: Obstetrics and Gynecology

## 2017-01-13 ENCOUNTER — Encounter: Payer: Self-pay | Admitting: *Deleted

## 2017-01-13 DIAGNOSIS — Z3A25 25 weeks gestation of pregnancy: Secondary | ICD-10-CM | POA: Diagnosis not present

## 2017-01-13 DIAGNOSIS — O26899 Other specified pregnancy related conditions, unspecified trimester: Secondary | ICD-10-CM | POA: Diagnosis present

## 2017-01-13 DIAGNOSIS — O26892 Other specified pregnancy related conditions, second trimester: Principal | ICD-10-CM | POA: Insufficient documentation

## 2017-01-13 DIAGNOSIS — R1030 Lower abdominal pain, unspecified: Secondary | ICD-10-CM | POA: Insufficient documentation

## 2017-01-13 DIAGNOSIS — R109 Unspecified abdominal pain: Secondary | ICD-10-CM

## 2017-01-13 HISTORY — DX: Other specified health status: Z78.9

## 2017-01-13 LAB — URINALYSIS, ROUTINE W REFLEX MICROSCOPIC
BILIRUBIN URINE: NEGATIVE
GLUCOSE, UA: NEGATIVE mg/dL
Hgb urine dipstick: NEGATIVE
KETONES UR: NEGATIVE mg/dL
LEUKOCYTES UA: NEGATIVE
Nitrite: NEGATIVE
PH: 6 (ref 5.0–8.0)
PROTEIN: NEGATIVE mg/dL
Specific Gravity, Urine: 1.012 (ref 1.005–1.030)

## 2017-01-13 MED ORDER — CALCIUM CARBONATE ANTACID 500 MG PO CHEW: 2.0000 | CHEWABLE_TABLET | ORAL | Status: DC | PRN

## 2017-01-13 MED ORDER — DOCUSATE SODIUM 100 MG PO CAPS: 100.0000 mg | ORAL_CAPSULE | Freq: Every day | ORAL | Status: DC

## 2017-01-13 MED ORDER — PRENATAL MULTIVITAMIN CH: 1.0000 | ORAL_TABLET | Freq: Every day | ORAL | Status: DC

## 2017-01-13 MED ORDER — ZOLPIDEM TARTRATE 5 MG PO TABS: 5.0000 mg | ORAL_TABLET | Freq: Every evening | ORAL | Status: DC | PRN

## 2017-01-13 MED ORDER — ACETAMINOPHEN 325 MG PO TABS: 650.0000 mg | ORAL_TABLET | ORAL | Status: DC | PRN

## 2017-01-13 NOTE — Discharge Summary (Signed)
Shawntrice Salle, Ihor Austinhomas J, MD  Physician  Obstetrics  Progress Notes  Signed  Date of Service:  01/13/2017 5:07 PM          Signed      Expand All Collapse All       [] Hide copied text  [] Hover for details   Patient ID: Vickie Hayes, female   DOB: 02-04-1986, 31 y.o.   MRN: [id number]  Subjective   Vickie Hayes is a 31 y.o. female. She is at 4774w6d gestation. Patient's last menstrual period was 08/13/2016 (approximate). Estimated Date of Delivery: 04/22/17  [facility] Chief complaint:abdominal cramping stating today . No LOF , NO vaginal bleeding , No prior h/o of PTL   Location: Onset/timing: Duration: Quality:  Severity: Aggravating or alleviating conditions: Associated signs/symptoms: Context:  S: Resting comfortably. noCTX, no VB.no LOF, Active fetal movement.   Maternal Medical History:       Past Medical History:  Diagnosis Date  . Bacterial vaginosis   . History of Papanicolaou smear of cervix 03/23/2013;10102017   NEG; NEG  . Medical history non-contributory   . Oligomenorrhea    OFF BC         Past Surgical History:  Procedure Laterality Date  . NEXPLANON INSERTION  04/23/2013  . NEXPLANON REMOVAL  10/2013   CHARLES DREW  . WISDOM TOOTH EXTRACTION Bilateral     No Known Allergies         Prior to Admission medications   Medication Sig Start Date End Date Taking? Authorizing Provider  clindamycin (CLEOCIN) 2 % vaginal cream Place 1 Applicatorful vaginally at bedtime. Once weekly as maintenance for 3 months 06/21/16   Copland, Ilona SorrelAlicia B, PA-C  etonogestrel-ethinyl estradiol (NUVARING) 0.12-0.015 MG/24HR vaginal ring Place 1 each vaginally every 28 (twenty-eight) days. Insert vaginally and leave in place for 3 consecutive weeks, then remove for 1 week.    [provider]  ibuprofen (ADVIL,MOTRIN) 200 MG tablet Take 600-800 mg by mouth every 6 (six) hours as needed for  headache.    [provider]  tinidazole (TINDAMAX) 500 MG tablet Take 500 mg by mouth daily as needed.    [provider]     Social History: She  reports that  has never smoked. she has never used smokeless tobacco. She reports that she does not drink alcohol or use drugs.  Family History: family history includes Cancer in her maternal grandmother; HIV in her mother; Hypertension in her maternal grandmother and mother; Tuberculosis in her mother.  no history of gyn cancers  Review of Systems: A full review of systems was performed and negative except as noted in the HPI.     O:   Objective   BP 123/72   Pulse 87   Temp 98.2 F (36.8 C) (Oral)   Resp 18   Ht 5\' 3"  (1.6 m)   Wt 83.5 kg (184 lb)   LMP 08/13/2016 (Approximate)   BMI 32.59 kg/m       Results for orders placed or performed during the hospital encounter of 01/13/17 (from the past 48 hour(s))  Urinalysis, Routine w reflex microscopic   Collection Time: 01/13/17  2:34 PM  Result Value Ref Range   Color, Urine YELLOW (A) YELLOW   APPearance CLEAR (A) CLEAR   Specific Gravity, Urine 1.012 1.005 - 1.030   pH 6.0 5.0 - 8.0   Glucose, UA NEGATIVE NEGATIVE mg/dL   Hgb urine dipstick NEGATIVE NEGATIVE   Bilirubin Urine  NEGATIVE NEGATIVE   Ketones, ur NEGATIVE NEGATIVE mg/dL   Protein, ur NEGATIVE NEGATIVE mg/dL   Nitrite NEGATIVE NEGATIVE   Leukocytes, UA NEGATIVE NEGATIVE     Constitutional: NAD, AAOx3  HE/ENT: extraocular movements grossly intact, moist mucous membranes CV: RRR PULM: nl respiratory effort, CTABL                                         Abd: gravid, non-tender, non-distended, soft                                                  Ext: Non-tender, Nonedmeatous                     Psych: mood appropriate, speech normal Pelvic closed / 50% vtx blt   NST: reassuring for 25 weeks  Baseline:  Variability: moderate Decelerations absent Time  20mins     A/P: 31 y.o. 9037w6d here for lower abdominal cramping . No cervical dilation Labor: not present.   Fetal Wellbeing: Reassuring for 25 week geststion    D/c home stable, precautions reviewed, follow-up as scheduled.   ----- Jennell Cornerhomas Greenlee Ancheta, MD Attending Obstetrician and Gynecologist [facility], Department of OB/GYN [facility]            Electronically signed by Silas Sedam, Ihor Austinhomas J, MD at 01/13/2017 5:12 PM

## 2017-01-13 NOTE — OB Triage Note (Signed)
REcvd to OBS1 per wheelchair c/o lower back and abdominal pain.  Changed to gown and to bed.  EFM applied.  Oriented to room and plan of care discussed.  Verbalized understanding and agrees with plan.

## 2017-01-13 NOTE — Progress Notes (Signed)
Patient ID: Vickie Hayes, female   DOB: 12-17-1985, 31 y.o.   MRN: [id number]  Vickie Hayes is a 31 y.o. female. She is at 228w6d gestation. Patient's last menstrual period was 08/13/2016 (approximate). Estimated Date of Delivery: 04/22/17  [facility] Chief complaint:abdominal cramping stating today . No LOF , NO vaginal bleeding , No prior h/o of PTL   Location: Onset/timing: Duration: Quality:  Severity: Aggravating or alleviating conditions: Associated signs/symptoms: Context:  S: Resting comfortably. no CTX, no VB.no LOF,  Active fetal movement.   Maternal Medical History:   Past Medical History:  Diagnosis Date  . Bacterial vaginosis   . History of Papanicolaou smear of cervix 03/23/2013;10102017   NEG; NEG  . Medical history non-contributory   . Oligomenorrhea    OFF BC    Past Surgical History:  Procedure Laterality Date  . NEXPLANON INSERTION  04/23/2013  . NEXPLANON REMOVAL  10/2013   CHARLES DREW  . WISDOM TOOTH EXTRACTION Bilateral     No Known Allergies  Prior to Admission medications   Medication Sig Start Date End Date Taking? Authorizing Provider  clindamycin (CLEOCIN) 2 % vaginal cream Place 1 Applicatorful vaginally at bedtime. Once weekly as maintenance for 3 months 06/21/16   Copland, Ilona SorrelAlicia B, PA-C  etonogestrel-ethinyl estradiol (NUVARING) 0.12-0.015 MG/24HR vaginal ring Place 1 each vaginally every 28 (twenty-eight) days. Insert vaginally and leave in place for 3 consecutive weeks, then remove for 1 week.    [provider]  ibuprofen (ADVIL,MOTRIN) 200 MG tablet Take 600-800 mg by mouth every 6 (six) hours as needed for headache.    [provider]  tinidazole (TINDAMAX) 500 MG tablet Take 500 mg by mouth daily as needed.    [provider]     Social History: She  reports that  has never smoked. she has never used smokeless tobacco. She reports that she does not drink alcohol or use  drugs.  Family History: family history includes Cancer in her maternal grandmother; HIV in her mother; Hypertension in her maternal grandmother and mother; Tuberculosis in her mother.  no history of gyn cancers  Review of Systems: A full review of systems was performed and negative except as noted in the HPI.     O:  BP 123/72   Pulse 87   Temp 98.2 F (36.8 C) (Oral)   Resp 18   Ht 5\' 3"  (1.6 m)   Wt 83.5 kg (184 lb)   LMP 08/13/2016 (Approximate)   BMI 32.59 kg/m  Results for orders placed or performed during the hospital encounter of 01/13/17 (from the past 48 hour(s))  Urinalysis, Routine w reflex microscopic   Collection Time: 01/13/17  2:34 PM  Result Value Ref Range   Color, Urine YELLOW (A) YELLOW   APPearance CLEAR (A) CLEAR   Specific Gravity, Urine 1.012 1.005 - 1.030   pH 6.0 5.0 - 8.0   Glucose, UA NEGATIVE NEGATIVE mg/dL   Hgb urine dipstick NEGATIVE NEGATIVE   Bilirubin Urine NEGATIVE NEGATIVE   Ketones, ur NEGATIVE NEGATIVE mg/dL   Protein, ur NEGATIVE NEGATIVE mg/dL   Nitrite NEGATIVE NEGATIVE   Leukocytes, UA NEGATIVE NEGATIVE     Constitutional: NAD, AAOx3  HE/ENT: extraocular movements grossly intact, moist mucous membranes CV: RRR PULM: nl respiratory effort, CTABL     Abd: gravid, non-tender, non-distended, soft      Ext: Non-tender, Nonedmeatous   Psych: mood appropriate, speech normal Pelvic closed / 50% vtx blt  NST: reassuring for 25 weeks  Baseline:  Variability: moderate Decelerations absent Time 20mins     A/P: 31 y.o. 4769w6d here for lower abdominal cramping . No cervical dilation Labor: not present.   Fetal Wellbeing: Reassuring for 25 week geststion    D/c home stable, precautions reviewed, follow-up as scheduled.   ----- Jennell Cornerhomas Schermerhorn, MD Attending Obstetrician and Gynecologist [facility], Department of OB/GYN [facility]

## 2017-02-15 ENCOUNTER — Encounter: Payer: BLUE CROSS/BLUE SHIELD | Attending: Certified Nurse Midwife | Admitting: *Deleted

## 2017-02-15 ENCOUNTER — Encounter: Payer: Self-pay | Admitting: *Deleted

## 2017-02-15 VITALS — BP 118/80 | Ht 63.0 in | Wt 189.1 lb

## 2017-02-15 DIAGNOSIS — Z713 Dietary counseling and surveillance: Secondary | ICD-10-CM | POA: Diagnosis not present

## 2017-02-15 DIAGNOSIS — O24419 Gestational diabetes mellitus in pregnancy, unspecified control: Secondary | ICD-10-CM | POA: Insufficient documentation

## 2017-02-15 DIAGNOSIS — O24410 Gestational diabetes mellitus in pregnancy, diet controlled: Secondary | ICD-10-CM

## 2017-02-15 NOTE — Patient Instructions (Signed)
Read booklet on Gestational Diabetes Follow Gestational Meal Planning Guidelines Don't skip meals Avoid cold cereal and fruit for breakfast Avoid juices and sugar sweetened drinks Complete a 3 Day Food Record and bring to next appointment Check blood sugars 4 x day - before breakfast and 2 hrs after every meal and record  Bring blood sugar log to all appointments Call MD for prescription for meter strips and lancets Strips  Accu-Chek Guide Lancets   Accu- Chek FastClix Purchase urine ketone strips if blood sugars not controlled and check urine ketones every am:  If + increase bedtime snack to 1 protein and 2 carbohydrate servings Walk 20-30 minutes at least 5 x week if permitted by MD

## 2017-02-15 NOTE — Progress Notes (Signed)
Diabetes Self-Management Education  Visit Type: First/Initial  Appt. Start Time: 1315 Appt. End Time: 1445  02/15/2017  Ms. [place] Warehime, identified by name and date of birth, is a 31 y.o. female with a diagnosis of Diabetes: Gestational Diabetes.   ASSESSMENT  Blood pressure 118/80, height 5\' 3"  (1.6 m), weight 189 lb 1.6 oz (85.8 kg), last menstrual period 08/13/2016. Body mass index is 33.5 kg/m.  Diabetes Self-Management Education - 02/15/17 1520      Visit Information   Visit Type  First/Initial      Initial Visit   Diabetes Type  Gestational Diabetes    Are you currently following a meal plan?  No    Are you taking your medications as prescribed?  No She is not taking Prenatal vitamin. Encouraged her to refill and take as directed.     Date Diagnosed  last week      Health Coping   How would you rate your overall health?  Good      Psychosocial Assessment   Patient Belief/Attitude about Diabetes  Other (comment) "I don't know"    Self-care barriers  None    Self-management support  Doctor's office;Family    Patient Concerns  Nutrition/Meal planning;Monitoring    Special Needs  None    Preferred Learning Style  Auditory    Learning Readiness  Ready    How often do you need to have someone help you when you read instructions, pamphlets, or other written materials from your doctor or pharmacy?  1 - Never    What is the last grade level you completed in school?  12th      Pre-Education Assessment   Patient understands the diabetes disease and treatment process.  Needs Instruction    Patient understands incorporating nutritional management into lifestyle.  Needs Instruction    Patient undertands incorporating physical activity into lifestyle.  Needs Instruction    Patient understands using medications safely.  Needs Instruction    Patient understands monitoring blood glucose, interpreting and using results  Needs Instruction    Patient understands prevention,  detection, and treatment of acute complications.  Needs Instruction    Patient understands prevention, detection, and treatment of chronic complications.  Needs Instruction    Patient understands how to develop strategies to address psychosocial issues.  Needs Instruction    Patient understands how to develop strategies to promote health/change behavior.  Needs Instruction      Complications   How often do you check your blood sugar?  0 times/day (not testing) Provided Accu-Chek Guide meter and instructed on use. BG upon return demonstration was 86 mg/dL at 6:962:35 pm - 3 1/2 hrs pp.     Have you had a dilated eye exam in the past 12 months?  No    Have you had a dental exam in the past 12 months?  No    Are you checking your feet?  No      Dietary Intake   Breakfast  oatmeal and oranges; cereal and milk    Lunch  usually eats out - BBQ and fries, Tacos    Snack (afternoon)  fruit    Dinner  chicken, beef, fish with potatoes, beans, peas, corn, rice, salads    Beverage(s)  water, sodas, fruit juices      Exercise   Exercise Type  ADL's      Patient Education   Previous Diabetes Education  No    Disease state   Definition of diabetes, type  1 and 2, and the diagnosis of diabetes;Factors that contribute to the development of diabetes    Nutrition management   Role of diet in the treatment of diabetes and the relationship between the three main macronutrients and blood glucose level;Reviewed blood glucose goals for pre and post meals and how to evaluate the patients' food intake on their blood glucose level.    Physical activity and exercise   Role of exercise on diabetes management, blood pressure control and cardiac health.    Monitoring  Taught/evaluated SMBG meter.;Purpose and frequency of SMBG.;Taught/discussed recording of test results and interpretation of SMBG.;Ketone testing, when, how.    Chronic complications  Relationship between chronic complications and blood glucose control     Psychosocial adjustment  Identified and addressed patients feelings and concerns about diabetes    Preconception care  Pregnancy and GDM  Role of pre-pregnancy blood glucose control on the development of the fetus;Role of family planning for patients with diabetes;Reviewed with patient blood glucose goals with pregnancy      Individualized Goals (developed by patient)   Reducing Risk Prevent diabetes complications     Outcomes   Expected Outcomes  Demonstrated interest in learning. Expect positive outcomes       Individualized Plan for Diabetes Self-Management Training:   Learning Objective:  Patient will have a greater understanding of diabetes self-management. Patient education plan is to attend individual and/or group sessions per assessed needs and concerns.   Plan:   Patient Instructions  Read booklet on Gestational Diabetes Follow Gestational Meal Planning Guidelines Don't skip meals Avoid cold cereal and fruit for breakfast Avoid juices and sugar sweetened drinks Complete a 3 Day Food Record and bring to next appointment Check blood sugars 4 x day - before breakfast and 2 hrs after every meal and record  Bring blood sugar log to all appointments Call MD for prescription for meter strips and lancets Strips  Accu-Chek Guide Lancets   Accu- Chek FastClix Purchase urine ketone strips if blood sugars not controlled and check urine ketones every am:  If + increase bedtime snack to 1 protein and 2 carbohydrate servings Walk 20-30 minutes at least 5 x week if permitted by MD   Expected Outcomes:  Demonstrated interest in learning. Expect positive outcomes  Education material provided:  Gestational Booklet Gestational Meal Planning Guidelines Simple Meal Plan Viewed Gestational Diabetes Video Meter = Accu-Chek Guide 3 Day Food Record Goals for a Healthy Pregnancy  If problems or questions, patient to contact team via:  Sharion SettlerSheila Shotwell, RN, CCM, CDE [phone]  Future  DSME appointment:  February 22, 2017 with the dietitian

## 2017-02-21 ENCOUNTER — Ambulatory Visit: Payer: BLUE CROSS/BLUE SHIELD | Admitting: *Deleted

## 2017-02-22 ENCOUNTER — Ambulatory Visit: Payer: BLUE CROSS/BLUE SHIELD | Admitting: Dietician

## 2017-02-24 ENCOUNTER — Telehealth: Payer: Self-pay | Admitting: Dietician

## 2017-02-24 NOTE — Telephone Encounter (Signed)
Called patient to reschedule missed appointment from 02/22/17. Left a voicemail message requesting a call back.

## 2017-03-08 NOTE — L&D Delivery Note (Signed)
Date of delivery: 03/31/2017 Estimated Date of Delivery: 04/22/17 Patient's last menstrual period was 08/13/2016 (approximate). EGA: 6167w6d  Delivery Note [place] Hayes presented to L&D with reports of contractions since midnight last night and an elevated blood pressure in the office. Her contractions spaced out and she was given simultaneous doses of buccal and vaginal misoprostol once. IV Stadol was given for pain relief. She had SROM for bloody fluid around 2100, quickly progressed to complete. FHR tracing category II during second stage for deep variables which recovered to a baseline around 140bpm with accelerations during pushing. She had a spontaneous vaginal birth of a live female over an intact perineum without anesthesia at 2118. The fetal head was delivered with restitution to ROT. Anterior then posterior shoulders delivered spontaneously. Baby placed on mom's chest and attended to by transition RN. Cord clamped and cut when pulseless. Cord blood obtained for newborn labs. Placenta delivered spontaneously and intact with 3 cord vessels. IV pitocin given for hemorrhage prophylaxis. EBL 350 mL.  Anesthesia: none Episiotomy: none Lacerations: none Suture Repair: n/a Est. Blood Loss (mL): 350mL   APGAR: 8, 9; weight 6 lb 11.2 oz (3040 g).    Mom to postpartum.  Baby to Couplet care / Skin to Skin.  Genia DelMargaret Beryle Bagsby, [place] 03/31/2017 9:58 PM

## 2017-03-11 ENCOUNTER — Encounter: Payer: Self-pay | Admitting: Dietician

## 2017-03-11 NOTE — Progress Notes (Signed)
Patient has not yet responded to reschedule her appointment, which was missed on 02/22/17. Sent discharge letter to referring provider.

## 2017-03-18 ENCOUNTER — Observation Stay: Payer: BLUE CROSS/BLUE SHIELD

## 2017-03-18 ENCOUNTER — Observation Stay
Admission: EM | Admit: 2017-03-18 | Discharge: 2017-03-19 | Disposition: A | Discharge status: Home / Self Care | Payer: BLUE CROSS/BLUE SHIELD | Attending: Certified Nurse Midwife | Admitting: Certified Nurse Midwife

## 2017-03-18 DIAGNOSIS — O163 Unspecified maternal hypertension, third trimester: Secondary | ICD-10-CM | POA: Insufficient documentation

## 2017-03-18 DIAGNOSIS — Z3A35 35 weeks gestation of pregnancy: Secondary | ICD-10-CM | POA: Diagnosis not present

## 2017-03-18 DIAGNOSIS — R51 Headache: Secondary | ICD-10-CM | POA: Insufficient documentation

## 2017-03-18 DIAGNOSIS — O26893 Other specified pregnancy related conditions, third trimester: Secondary | ICD-10-CM | POA: Diagnosis present

## 2017-03-18 DIAGNOSIS — O10919 Unspecified pre-existing hypertension complicating pregnancy, unspecified trimester: Secondary | ICD-10-CM | POA: Diagnosis present

## 2017-03-18 HISTORY — DX: Essential (primary) hypertension: I10

## 2017-03-18 LAB — CBC
HEMATOCRIT: 35.4 % (ref 35.0–47.0)
Hemoglobin: 11.9 g/dL — ABNORMAL LOW (ref 12.0–16.0)
MCH: 30.8 pg (ref 26.0–34.0)
MCHC: 33.6 g/dL (ref 32.0–36.0)
MCV: 91.6 fL (ref 80.0–100.0)
Platelets: 127 10*3/uL — ABNORMAL LOW (ref 150–440)
RBC: 3.87 MIL/uL (ref 3.80–5.20)
RDW: 13.8 % (ref 11.5–14.5)
WBC: 12.9 10*3/uL — ABNORMAL HIGH (ref 3.6–11.0)

## 2017-03-18 LAB — COMPREHENSIVE METABOLIC PANEL
ALBUMIN: 3.3 g/dL — AB (ref 3.5–5.0)
ALT: 17 U/L (ref 14–54)
AST: 27 U/L (ref 15–41)
Alkaline Phosphatase: 139 U/L — ABNORMAL HIGH (ref 38–126)
Anion gap: 12 (ref 5–15)
BILIRUBIN TOTAL: 0.6 mg/dL (ref 0.3–1.2)
BUN: 10 mg/dL (ref 6–20)
CHLORIDE: 102 mmol/L (ref 101–111)
CO2: 18 mmol/L — ABNORMAL LOW (ref 22–32)
CREATININE: 0.48 mg/dL (ref 0.44–1.00)
Calcium: 9.1 mg/dL (ref 8.9–10.3)
GFR calc Af Amer: >60 mL/min (ref >60)
GFR calc non Af Amer: >60 mL/min (ref >60)
GLUCOSE: 98 mg/dL (ref 65–99)
POTASSIUM: 3.5 mmol/L (ref 3.5–5.1)
Sodium: 132 mmol/L — ABNORMAL LOW (ref 135–145)
TOTAL PROTEIN: 6.7 g/dL (ref 6.5–8.1)

## 2017-03-18 LAB — PROTEIN / CREATININE RATIO, URINE
Creatinine, Urine: 42 mg/dL
Total Protein, Urine: <6 mg/dL

## 2017-03-18 MED ORDER — DIPHENHYDRAMINE HCL 50 MG/ML IJ SOLN
25.0000 mg | Freq: Once | INTRAMUSCULAR | Status: AC
  Administered 2017-03-19: 25 mg via INTRAVENOUS
  Filled 2017-03-18: qty 1

## 2017-03-18 MED ORDER — SODIUM CHLORIDE FLUSH 0.9 % IV SOLN
INTRAVENOUS | Status: AC
  Administered 2017-03-19: 10 mL
  Filled 2017-03-18: qty 10

## 2017-03-18 MED ORDER — ACETAMINOPHEN 325 MG PO TABS
650.0000 mg | ORAL_TABLET | ORAL | Status: DC | PRN
  Administered 2017-03-18 (×2): 650 mg via ORAL
  Filled 2017-03-18 (×2): qty 2

## 2017-03-18 MED ORDER — SODIUM CHLORIDE 0.9 % IV SOLN
25.0000 mg | Freq: Once | INTRAVENOUS | Status: DC
  Filled 2017-03-18: qty 1

## 2017-03-18 MED ORDER — BETAMETHASONE SOD PHOS & ACET 6 (3-3) MG/ML IJ SUSP
INTRAMUSCULAR | Status: AC
  Filled 2017-03-18: qty 1

## 2017-03-18 MED ORDER — SODIUM CHLORIDE 0.9 % IV BOLUS (SEPSIS): 1000.0000 mL | Freq: Once | INTRAVENOUS | Status: DC

## 2017-03-18 MED ORDER — BETAMETHASONE SOD PHOS & ACET 6 (3-3) MG/ML IJ SUSP
12.0000 mg | Freq: Once | INTRAMUSCULAR | Status: AC
  Administered 2017-03-18: 12 mg via INTRAMUSCULAR

## 2017-03-18 MED ORDER — MAGNESIUM SULFATE IN D5W 1-5 GM/100ML-% IV SOLN
1.0000 g | Freq: Once | INTRAVENOUS | Status: DC
  Filled 2017-03-18: qty 100

## 2017-03-18 MED ORDER — METOCLOPRAMIDE HCL 5 MG/ML IJ SOLN
10.0000 mg | Freq: Once | INTRAMUSCULAR | Status: DC
  Filled 2017-03-18: qty 2

## 2017-03-18 MED ORDER — CALCIUM CARBONATE ANTACID 500 MG PO CHEW: 2.0000 | CHEWABLE_TABLET | ORAL | Status: DC | PRN

## 2017-03-18 MED ORDER — PRENATAL MULTIVITAMIN CH: 1.0000 | ORAL_TABLET | Freq: Every day | ORAL | Status: DC

## 2017-03-18 NOTE — Progress Notes (Signed)
Vickie Hayes is a 32 y.o. female. She is at [redacted]w[redacted]d gestation. Patient's last menstrual period was 08/13/2016 (approximate). Estimated Date of Delivery: 04/22/17  Prenatal care site: [facility]   Chief complaint: headache  Location: bilateral temples Onset/timing: this afternoon around 3pm-4pm Duration: constant Quality: throbbing Severity: 7-8/10 at its worst, now 3/10 Aggravating or alleviating conditions: none Associated signs/symptoms: BP 152/95 Context: Vickie Hayes was at work today when she developed a severe headache, located in her bilateral temples/eyes. She rated it 7-8/10 at its worst. She checked her blood pressure around the same tim and it was 152/95. Because of her headache and blood pressure, she called the office and reported to triage for evaluation. She now reports that her headache is improved, but still 3/10 pain, even after a dose of Tylenol.   S: Resting in bed, appears uncomfortable with some contractions. Frequent UCs, difficult for her to differentiate if they are [employer] contractions or labor contractions. No vaginal bleeding, no leakage of fluid, active fetal movement.   Maternal Medical History:   Past Medical History:  Diagnosis Date  . Bacterial vaginosis   . Gestational diabetes    pt denies  . History of Papanicolaou smear of cervix 03/23/2013;10102017   NEG; NEG  . Hypertension   . Medical history non-contributory   . Oligomenorrhea    OFF BC    Past Surgical History:  Procedure Laterality Date  . NEXPLANON INSERTION  04/23/2013  . NEXPLANON REMOVAL  10/2013   CHARLES DREW  . WISDOM TOOTH EXTRACTION Bilateral     No Known Allergies  Prior to Admission medications   Medication Sig Start Date End Date Taking? Authorizing Provider  Prenatal Vit-Fe Fumarate-FA (PRENATAL MULTIVITAMIN) TABS tablet Take 1 tablet by mouth daily at 12 noon.    [provider]     Social History: She  reports that  has never smoked. she has  never used smokeless tobacco. She reports that she does not drink alcohol or use drugs.  Family History: family history includes Cancer in her maternal grandmother; Diabetes in her maternal grandmother; HIV in her mother; Hypertension in her maternal grandmother and mother; Tuberculosis in her mother.   Review of Systems: A full review of systems was performed and negative except as noted in the HPI.     O:  BP 131/78   Pulse 98   Temp 98.1 F (36.7 C) (Oral)   Resp (!) 22   Ht 5\' 3"  (1.6 m)   Wt 88 kg (194 lb)   LMP 08/13/2016 (Approximate)   BMI 34.37 kg/m  Results for orders placed or performed during the hospital encounter of 03/18/17 (from the past 48 hour(s))  Comprehensive metabolic panel   Collection Time: 03/18/17  5:16 PM  Result Value Ref Range   Sodium 132 (L) 135 - 145 mmol/L   Potassium 3.5 3.5 - 5.1 mmol/L   Chloride 102 101 - 111 mmol/L   CO2 18 (L) 22 - 32 mmol/L   Glucose, Bld 98 65 - 99 mg/dL   BUN 10 6 - 20 mg/dL   Creatinine, Ser 9.56 0.44 - 1.00 mg/dL   Calcium 9.1 8.9 - 21.3 mg/dL   Total Protein 6.7 6.5 - 8.1 g/dL   Albumin 3.3 (L) 3.5 - 5.0 g/dL   AST 27 15 - 41 U/L   ALT 17 14 - 54 U/L   Alkaline Phosphatase 139 (H) 38 - 126 U/L   Total Bilirubin 0.6 0.3 - 1.2 mg/dL  GFR calc non Af Amer >60 >60 mL/min   GFR calc Af Amer >60 >60 mL/min   Anion gap 12 5 - 15  CBC on admission   Collection Time: 03/18/17  5:16 PM  Result Value Ref Range   WBC 12.9 (H) 3.6 - 11.0 K/uL   RBC 3.87 3.80 - 5.20 MIL/uL   Hemoglobin 11.9 (L) 12.0 - 16.0 g/dL   HCT 40.935.4 81.135.0 - 91.447.0 %   MCV 91.6 80.0 - 100.0 fL   MCH 30.8 26.0 - 34.0 pg   MCHC 33.6 32.0 - 36.0 g/dL   RDW 78.213.8 95.611.5 - 21.314.5 %   Platelets 127 (L) 150 - 440 K/uL     Constitutional: NAD, AAOx3  HE/ENT: extraocular movements grossly intact, moist mucous membranes CV: RRR PULM: nl respiratory effort, CTABL     Abd: gravid, non-tender, non-distended, soft      Ext: Non-tender, Nonedmeatous   Psych:  mood appropriate, speech normal Pelvic: SVE 2.5/25%/-3, midposition/soft  FHT/toco:  Baseline: 145bpm Variability: moderate Accelerations: 15x15 present x >2 Decelerations: occasional variables present UCs q3733m, ranging q3-247m   A/P: 32 y.o. 2638w0d here for antenatal surveillance for r/o PTL and r/o superimposed preeclampsia  Continue CEFM/toco  CBC and CMP WNL, P/C ratio pending  Plan to recheck cervix  Betamethasone given for fetal lung maturity  Discussed with Dr. Christeen DouglasBethany Beasley, who is in agreement with the above plan.    Vickie Hayes Naina Hayes 03/18/2017 7:11 PM  ----- Vickie Hayes Vickie Hayes, CNM Certified Nurse Midwife [facility], Department of OB/GYN [facility]

## 2017-03-18 NOTE — OB Triage Note (Addendum)
Pt presents to [facility] from office d/t elevated BP. Pt has a headache that began "around 4pm" today and new onset blurry vision as of today. Pt states her coworkers say her face "looks puffy." 2+ reflexes, no clonus. Pt denies epigastric pain. Initial BP 130/81, cycling Q15. Pt denies vaginal bleeding, LOF and reports positive fetal movement. VSS. Monitors applied and assessing.

## 2017-03-19 ENCOUNTER — Other Ambulatory Visit: Payer: Self-pay | Admitting: Certified Nurse Midwife

## 2017-03-19 ENCOUNTER — Inpatient Hospital Stay
Admission: EM | Admit: 2017-03-19 | Discharge: 2017-03-19 | Disposition: A | Discharge status: Home / Self Care | Payer: BLUE CROSS/BLUE SHIELD | Attending: Certified Nurse Midwife | Admitting: Certified Nurse Midwife

## 2017-03-19 DIAGNOSIS — O4700 False labor before 37 completed weeks of gestation, unspecified trimester: Secondary | ICD-10-CM | POA: Insufficient documentation

## 2017-03-19 DIAGNOSIS — Z3A35 35 weeks gestation of pregnancy: Secondary | ICD-10-CM | POA: Insufficient documentation

## 2017-03-19 MED ORDER — BETAMETHASONE SOD PHOS & ACET 6 (3-3) MG/ML IJ SUSP
12.0000 mg | Freq: Once | INTRAMUSCULAR | Status: AC
  Administered 2017-03-19: 12 mg via INTRAMUSCULAR

## 2017-03-19 NOTE — Discharge Instructions (Signed)
Instructions for Patients & Families  Preterm Labor    What is Preterm Labor?  Preterm labor is labor that happens before 37 weeks of pregnancy. Preterm labor involves regular contractions and cervical change. Cervical change is opening (dilating) and thinning out (effacing) of the cervix. It is not possible to tell which contractions will change the cervix. If you have frequent, regular contractions before 37 weeks of pregnancy, you need to contact your provider.   Why is Preterm Labor a problem?  Babies that are born before 37 weeks have many different types of problems, depending on how young or small they are. While many premature babies will survive, they may have problems breathing, fighting off infection, keeping a normal body temperature, and eating.  Preterm baby's health problems may continue for a long time, even years. You may know someone who has had a preterm baby or have a previous child who was born early. That is fine, but that does not guarantee that the child you are carrying now will be okay. It also does not guarantee that a baby who does fine after birth will not have problems when they become school age.   Who may have Preterm Labor?  Anyone can have preterm labor but certain people are at a higher risk.   Previous premature labor or preterm birth   Infections   Oddly shaped uterus   More than one baby (Twins, triplets, etc.)   Weak cervix   Too much fluid around the baby   Premature rupture of membranes (water bag breaks early)   Large fibroid in the uterus   Cocaine use   What are the signs of Preterm Labor?  One or more of the following signs may mean that preterm labor is starting. If you feel any of these prior to 37 weeks contact your provider immediately.   Contractions- Contractions, or tightening of the uterus (womb), can happen sometimes throughout the pregnancy but is it not normal to contract frequently and regularly (every 15 minutes or closer)  before 37 weeks of pregnancy. Preterm contractions may be painless, but the uterus will feel hard all over.   Menstrual-type Cramps- Usually felt very low in the abdomen, these cramps may come and go or may be constant.   Lower Backache-This is a dull pain at the bottom of your spine, which may move to you sides or belly. It may come and go or may be constant.   Pressure- Pressure is felt in the vagina, as if the baby were going to fall out.   Intestinal Cramps- This can feel like gas pains, and diarrhea may also happen.   Change in Vaginal Discharge-Your discharge may become heavier or watery, and may be tinged with blood.   What will happen next?  When you call, your provider may tell you to lie down and feel for the contractions for an hour while you drink some water. You feel for contractions by lying on your left side and putting your fingertips on your abdomen. Your uterus will feel tight and firm (like your forehead) when you are having a contraction and soft (like the tip or your nose) when you are not contracting. Time your contractions from the beginning of one to the beginning of the next for an hour. If you are having contractions or tightening every fifteen minutes or closer, come to the hospital.   It is very important that you don't ignore symptoms of preterm labor  Our best chance to treat preterm  labor is to treat early.  ° °If you have any further questions, please contact your provider  °(Doctor, nurse practitioner or nurse midwife).  ° °Call your provider or come back to the hospital if you have any of the following:  °· 4 or more contractions in one hour before 37 weeks of pregnancy  °· Leakage of fluid or you think your water breaks  °· Bright red bleeding from your vagina  °· Fever greater than 100.4  °· Decrease in your baby’s normal movements  °· If you feel a decrease in your baby’s normal movements and are greater than 28 weeks you should do kick counts. You should count  your baby’s movements at least once a day for 2 hours while lying down on your side. You should feel at least 10 movements in this time period  ° ° ° ° °

## 2017-03-19 NOTE — Progress Notes (Signed)
Progress Note:   S: Resting comfortably in bed. Reports that headache has gone away after last dose of Tylenol. Still reporting 4/10 pain headache, requesting Tylenol. Infrequent contractions. No VB, no LOF, active fetal movement.   O:  BP 139/81   Pulse 89   Temp 98.3 F (36.8 C) (Oral)   Resp 18   Ht 5\' 3"  (1.6 m)   Wt 88 kg (194 lb)   LMP 08/13/2016 (Approximate)   BMI 34.37 kg/m   Constitutional: NAD, AAOx3  PULM: nl respiratory effort    Psych: mood appropriate, speech normal Pelvic: deferred at this time  FHT/toco:  Baseline: 145bpm Variability: moderate Accelerations: 15x15 present x >2 Decelerations: occasional variables UCs q10-16 minutes   A/P: 32 y.o. 1742w1d here for antenatal surveillance for r/o PTL and r/o superimposed preeclampsia   Labor: not present.   Fetal Wellbeing: Overall reassuring Cat II tracing, with occasional variable decelerations  Headache resolved  Plan to monitor FHT overnight   Reviewed tracing and plan of care with Dr. Christeen DouglasBethany Beasley who is in agreement with this plan.    ----- Genia DelMargaret Mellanie Bejarano, CNM Certified Nurse Midwife [facility], Department of OB/GYN [facility]

## 2017-03-19 NOTE — Progress Notes (Signed)
Progress Note:   S: Resting comfortably in bed. Still reporting 4/10 pain headache, requesting Tylenol. No longer feeling frequent contractions. No VB, no LOF, active fetal movement.   O:  BP 139/81   Pulse 89   Temp 98.3 F (36.8 C) (Oral)   Resp 18   Ht 5\' 3"  (1.6 m)   Wt 88 kg (194 lb)   LMP 08/13/2016 (Approximate)   BMI 34.37 kg/m  Results for orders placed or performed during the hospital encounter of 03/18/17 (from the past 48 hour(s))  Protein / creatinine ratio, urine   Collection Time: 03/18/17  4:50 PM  Result Value Ref Range   Creatinine, Urine 42 mg/dL   Total Protein, Urine <6 mg/dL   Protein Creatinine Ratio        0.00 - 0.15 mg/mg[Cre]  Comprehensive metabolic panel   Collection Time: 03/18/17  5:16 PM  Result Value Ref Range   Sodium 132 (L) 135 - 145 mmol/L   Potassium 3.5 3.5 - 5.1 mmol/L   Chloride 102 101 - 111 mmol/L   CO2 18 (L) 22 - 32 mmol/L   Glucose, Bld 98 65 - 99 mg/dL   BUN 10 6 - 20 mg/dL   Creatinine, Ser 1.610.48 0.44 - 1.00 mg/dL   Calcium 9.1 8.9 - 09.610.3 mg/dL   Total Protein 6.7 6.5 - 8.1 g/dL   Albumin 3.3 (L) 3.5 - 5.0 g/dL   AST 27 15 - 41 U/L   ALT 17 14 - 54 U/L   Alkaline Phosphatase 139 (H) 38 - 126 U/L   Total Bilirubin 0.6 0.3 - 1.2 mg/dL   GFR calc non Af Amer >60 >60 mL/min   GFR calc Af Amer >60 >60 mL/min   Anion gap 12 5 - 15  CBC on admission   Collection Time: 03/18/17  5:16 PM  Result Value Ref Range   WBC 12.9 (H) 3.6 - 11.0 K/uL   RBC 3.87 3.80 - 5.20 MIL/uL   Hemoglobin 11.9 (L) 12.0 - 16.0 g/dL   HCT 04.535.4 40.935.0 - 81.147.0 %   MCV 91.6 80.0 - 100.0 fL   MCH 30.8 26.0 - 34.0 pg   MCHC 33.6 32.0 - 36.0 g/dL   RDW 91.413.8 78.211.5 - 95.614.5 %   Platelets 127 (L) 150 - 440 K/uL     Constitutional: NAD, AAOx3  PULM: nl respiratory effort    Psych: mood appropriate, speech normal Pelvic: SVE 2.5/25%/-3, midposition/soft  FHT/toco:  Baseline: 145bpm Variability: moderate Accelerations: 15x15 present x >2 Decelerations:  occasional variables, one prolonged variable lasting 2 minutes with nadir to 90bpm   A/P: 32 y.o. 2444w1d here for antenatal surveillance for r/o PTL and r/o superimposed preeclampsia   Labor: not present.   Fetal Wellbeing: Overall reassuring Cat II tracing, with occasional variable decelerations and one prolonged variable.  Plan to give additional medication for headache and monitor FHT overnight   Reviewed tracing and plan of care with Dr. Christeen DouglasBethany Beasley who is in agreement with this plan.    ----- Genia DelMargaret Chrishun Scheer, CNM Certified Nurse Midwife [facility], Department of OB/GYN [facility]

## 2017-03-19 NOTE — Progress Notes (Signed)
Progress Note:   S:  Sleeping in bed.   O:  BP 140/81   Pulse (!) 105   Temp 98.2 F (36.8 C) (Oral)   Resp 16   Ht 5\' 3"  (1.6 m)   Wt 88 kg (194 lb)   LMP 08/13/2016 (Approximate)   BMI 34.37 kg/m   Constitutional: NAD, sleeping  PULM: nl respiratory effort    Pelvic: deferred at this time  FHT/toco:  Baseline: 135bpm Variability: moderate Accelerations: 15x15 present x >2 Decelerations: occasional variables UCs rare   A/P: 32 y.o. 5170w1d here for antenatal surveillance for r/o PTL and r/o superimposed preeclampsia   Labor: not present.   Fetal Wellbeing: Overall reassuring Cat II tracing, with occasional variable decelerations  Plan to continue to monitor FHT overnight   ----- Genia DelMargaret Marlee Armenteros, CNM Certified Nurse Midwife [facility], Department of OB/GYN [facility]

## 2017-03-19 NOTE — Discharge Summary (Signed)
Vickie Hayes is a 32 y.o. female. She is at [redacted]w[redacted]d gestation. Patient's last menstrual period was 08/13/2016 (approximate). Estimated Date of Delivery: 04/22/17  Prenatal care site: [facility]  Chief complaint: headache  Location: bilateral temples Onset/timing: yesterday afternoon around 3pm-4pm Duration: constant Quality: throbbing Severity: 7-8/10 at its worst, now 3/10 Aggravating or alleviating conditions: none Associated signs/symptoms: BP 152/95 Context: Symphanie was at work today when she developed a severe headache, located in her bilateral temples/eyes. She rated it 7-8/10 at its worst. She checked her blood pressure around the same time and it was 152/95. Because of her headache and blood pressure, she called the office and reported to triage for evaluation. At the time of initial evaluation, she reported that her headache was improved, but still 3/10 pain, even after a dose of Tylenol.   S:  At the time of admission, she was having frequent UCs and a difficult time differentiating if they were Deberah Pelton or labor contractions. She never reported VB or LOF. She always reported active fetal movement.   Overnight her headache went away after a second dose of Tylenol. She continued to report active fetal movement, no VB, and no LOF. Contractions became occasional.   Maternal Medical History:   Past Medical History:  Diagnosis Date  . Bacterial vaginosis   . Gestational diabetes    pt denies  . History of Papanicolaou smear of cervix 03/23/2013;10102017   NEG; NEG  . Hypertension   . Medical history non-contributory   . Oligomenorrhea    OFF BC    Past Surgical History:  Procedure Laterality Date  . NEXPLANON INSERTION  04/23/2013  . NEXPLANON REMOVAL  10/2013   [facility]  . WISDOM TOOTH EXTRACTION Bilateral     No Known Allergies  Prior to Admission medications   Medication Sig Start Date End Date Taking? Authorizing Provider  Prenatal Vit-Fe  Fumarate-FA (PRENATAL MULTIVITAMIN) TABS tablet Take 1 tablet by mouth daily at 12 noon.    [provider]     Social History: She  reports that  has never smoked. she has never used smokeless tobacco. She reports that she does not drink alcohol or use drugs.  Family History: family history includes Cancer in her maternal grandmother; Diabetes in her maternal grandmother; HIV in her mother; Hypertension in her maternal grandmother and mother; Tuberculosis in her mother.   Review of Systems: A full review of systems was performed and negative except as noted in the HPI.    O:  BP 120/70   Pulse (!) 108   Temp 98.2 F (36.8 C) (Oral)   Resp 16   Ht 5\' 3"  (1.6 m)   Wt 88 kg (194 lb)   LMP 08/13/2016 (Approximate)   BMI 34.37 kg/m  Results for orders placed or performed during the hospital encounter of 03/18/17 (from the past 48 hour(s))  Protein / creatinine ratio, urine   Collection Time: 03/18/17  4:50 PM  Result Value Ref Range   Creatinine, Urine 42 mg/dL   Total Protein, Urine <6 mg/dL   Protein Creatinine Ratio        0.00 - 0.15 mg/mg[Cre]  Comprehensive metabolic panel   Collection Time: 03/18/17  5:16 PM  Result Value Ref Range   Sodium 132 (L) 135 - 145 mmol/L   Potassium 3.5 3.5 - 5.1 mmol/L   Chloride 102 101 - 111 mmol/L   CO2 18 (L) 22 - 32 mmol/L   Glucose, Bld 98 65 -  99 mg/dL   BUN 10 6 - 20 mg/dL   Creatinine, Ser 1.610.48 0.44 - 1.00 mg/dL   Calcium 9.1 8.9 - 09.610.3 mg/dL   Total Protein 6.7 6.5 - 8.1 g/dL   Albumin 3.3 (L) 3.5 - 5.0 g/dL   AST 27 15 - 41 U/L   ALT 17 14 - 54 U/L   Alkaline Phosphatase 139 (H) 38 - 126 U/L   Total Bilirubin 0.6 0.3 - 1.2 mg/dL   GFR calc non Af Amer >60 >60 mL/min   GFR calc Af Amer >60 >60 mL/min   Anion gap 12 5 - 15  CBC on admission   Collection Time: 03/18/17  5:16 PM  Result Value Ref Range   WBC 12.9 (H) 3.6 - 11.0 K/uL   RBC 3.87 3.80 - 5.20 MIL/uL   Hemoglobin 11.9 (L) 12.0 - 16.0 g/dL   HCT 04.535.4 40.935.0  - 81.147.0 %   MCV 91.6 80.0 - 100.0 fL   MCH 30.8 26.0 - 34.0 pg   MCHC 33.6 32.0 - 36.0 g/dL   RDW 91.413.8 78.211.5 - 95.614.5 %   Platelets 127 (L) 150 - 440 K/uL     Constitutional: NAD, AAOx3  HE/ENT: extraocular movements grossly intact, moist mucous membranes CV: RRR PULM: nl respiratory effort, CTABL     Abd: gravid, non-tender, non-distended, soft      Ext: Non-tender, Nonedmeatous   Psych: mood appropriate, speech normal Pelvic: SVE 2.5/25%/-3, midposition/soft, unchanged over 4 hours of frequent contractions on the monitor  Fetal heart tracing: Baseline: 135bpm Variability: moderate Accelerations: present x >2 Decelerations: occasional variable decelerations; with two possible prolonged variables each lasting about 2 minutes with nadir to 90s, tracing broken in each instance with nadir close to maternal heart rate   A/P: 32 y.o. 673w1d here for antenatal surveillance for r/o PTL and r/o superimposed preeclampsia  Labor: not present.   Fetal Wellbeing: Category II, overall reassuring  Headache resolved, all labs WNL  Betamethasone given 03/18/17 1830 for fetal lung maturity, plan for patient to return today for second dose  D/c home stable, precautions reviewed, follow-up as discussed in the office on Monday for NST/AFI.   Reviewed tracing and plan of care with Dr. Christeen DouglasBethany Beasley, who is in agreement with this plan.   ----- Genia DelMargaret Layah Skousen, CNM Certified Nurse Midwife [facility], Department of OB/GYN [facility]

## 2017-03-22 ENCOUNTER — Other Ambulatory Visit: Payer: Self-pay

## 2017-03-22 ENCOUNTER — Inpatient Hospital Stay
Admission: EM | Admit: 2017-03-22 | Discharge: 2017-03-22 | Disposition: A | Discharge status: Home / Self Care | Payer: BLUE CROSS/BLUE SHIELD | Attending: Obstetrics and Gynecology | Admitting: Obstetrics and Gynecology

## 2017-03-22 DIAGNOSIS — Z3A35 35 weeks gestation of pregnancy: Secondary | ICD-10-CM | POA: Diagnosis not present

## 2017-03-22 DIAGNOSIS — O139 Gestational [pregnancy-induced] hypertension without significant proteinuria, unspecified trimester: Secondary | ICD-10-CM

## 2017-03-22 DIAGNOSIS — O163 Unspecified maternal hypertension, third trimester: Secondary | ICD-10-CM | POA: Insufficient documentation

## 2017-03-22 LAB — COMPREHENSIVE METABOLIC PANEL
ALBUMIN: 3.1 g/dL — AB (ref 3.5–5.0)
ALK PHOS: 134 U/L — AB (ref 38–126)
ALT: 13 U/L — ABNORMAL LOW (ref 14–54)
ANION GAP: 10 (ref 5–15)
AST: 20 U/L (ref 15–41)
BILIRUBIN TOTAL: 0.5 mg/dL (ref 0.3–1.2)
BUN: 9 mg/dL (ref 6–20)
CALCIUM: 9.6 mg/dL (ref 8.9–10.3)
CO2: 19 mmol/L — ABNORMAL LOW (ref 22–32)
Chloride: 104 mmol/L (ref 101–111)
Creatinine, Ser: 0.57 mg/dL (ref 0.44–1.00)
GFR calc non Af Amer: >60 mL/min (ref >60)
GLUCOSE: 79 mg/dL (ref 65–99)
Potassium: 4 mmol/L (ref 3.5–5.1)
Sodium: 133 mmol/L — ABNORMAL LOW (ref 135–145)
TOTAL PROTEIN: 7.1 g/dL (ref 6.5–8.1)

## 2017-03-22 LAB — CBC
HCT: 36.4 % (ref 35.0–47.0)
Hemoglobin: 12 g/dL (ref 12.0–16.0)
MCH: 30.4 pg (ref 26.0–34.0)
MCHC: 33.1 g/dL (ref 32.0–36.0)
MCV: 91.9 fL (ref 80.0–100.0)
PLATELETS: 129 10*3/uL — AB (ref 150–440)
RBC: 3.96 MIL/uL (ref 3.80–5.20)
RDW: 14.1 % (ref 11.5–14.5)
WBC: 14 10*3/uL — ABNORMAL HIGH (ref 3.6–11.0)

## 2017-03-22 LAB — PROTEIN / CREATININE RATIO, URINE
CREATININE, URINE: 119 mg/dL
Protein Creatinine Ratio: 0.12 mg/mg{Cre} (ref 0.00–0.15)
TOTAL PROTEIN, URINE: 14 mg/dL

## 2017-03-22 MED ORDER — LABETALOL HCL 5 MG/ML IV SOLN: 20.0000 mg | INTRAVENOUS | Status: DC | PRN

## 2017-03-22 MED ORDER — ZOLPIDEM TARTRATE 5 MG PO TABS: 5.0000 mg | ORAL_TABLET | Freq: Every evening | ORAL | Status: DC | PRN

## 2017-03-22 MED ORDER — ACETAMINOPHEN 325 MG PO TABS: 650.0000 mg | ORAL_TABLET | ORAL | Status: DC | PRN

## 2017-03-22 MED ORDER — DOCUSATE SODIUM 100 MG PO CAPS: 100.0000 mg | ORAL_CAPSULE | Freq: Every day | ORAL | Status: DC

## 2017-03-22 MED ORDER — PRENATAL MULTIVITAMIN CH: 1.0000 | ORAL_TABLET | Freq: Every day | ORAL | Status: DC

## 2017-03-22 MED ORDER — HYDRALAZINE HCL 20 MG/ML IJ SOLN: 10.0000 mg | Freq: Once | INTRAMUSCULAR | Status: DC | PRN

## 2017-03-22 MED ORDER — BETAMETHASONE SOD PHOS & ACET 6 (3-3) MG/ML IJ SUSP: 12.0000 mg | INTRAMUSCULAR | Status: DC

## 2017-03-22 MED ORDER — CALCIUM CARBONATE ANTACID 500 MG PO CHEW: 2.0000 | CHEWABLE_TABLET | ORAL | Status: DC | PRN

## 2017-03-22 NOTE — OB Triage Note (Signed)
Pt. Is being sent over has been sent from [facility] for observation of gestational hypertension. Pt. States that she isn't having any pain at the moment and denies having any vaginal bleeding or discharge.

## 2017-03-22 NOTE — Progress Notes (Signed)
Patient ID: Vickie Hayes, female   DOB: 06/17/1985, 32 y.o.   MRN: [id number]  TRIAGE VISIT with NST   Vickie Hayes is a 32 y.o. [id number]. She is at 7191w4d gestation.  Indication: Elevated Blood Pressure in clinic - hx of cHTN with superimposed gHTN  S: Resting comfortably. no CTX, no VB. Active fetal movement. Denies headache now but had one earlier, SOB, new onset swelling, RUQ pain, visual changes. O:  BP 107/62   Pulse 88   Temp 97.9 F (36.6 C) (Oral)   Ht 5\' 3"  (1.6 m)   Wt 89.4 kg (197 lb)   LMP 08/13/2016 (Approximate)   BMI 34.90 kg/m  Results for orders placed or performed during the hospital encounter of 03/22/17 (from the past 48 hour(s))  Comprehensive metabolic panel   Collection Time: 03/22/17 12:34 PM  Result Value Ref Range   Sodium 133 (L) 135 - 145 mmol/L   Potassium 4.0 3.5 - 5.1 mmol/L   Chloride 104 101 - 111 mmol/L   CO2 19 (L) 22 - 32 mmol/L   Glucose, Bld 79 65 - 99 mg/dL   BUN 9 6 - 20 mg/dL   Creatinine, Ser 1.470.57 0.44 - 1.00 mg/dL   Calcium 9.6 8.9 - 82.910.3 mg/dL   Total Protein 7.1 6.5 - 8.1 g/dL   Albumin 3.1 (L) 3.5 - 5.0 g/dL   AST 20 15 - 41 U/L   ALT 13 (L) 14 - 54 U/L   Alkaline Phosphatase 134 (H) 38 - 126 U/L   Total Bilirubin 0.5 0.3 - 1.2 mg/dL   GFR calc non Af Amer >60 >60 mL/min   GFR calc Af Amer >60 >60 mL/min   Anion gap 10 5 - 15  CBC   Collection Time: 03/22/17 12:34 PM  Result Value Ref Range   WBC 14.0 (H) 3.6 - 11.0 K/uL   RBC 3.96 3.80 - 5.20 MIL/uL   Hemoglobin 12.0 12.0 - 16.0 g/dL   HCT 56.236.4 13.035.0 - 86.547.0 %   MCV 91.9 80.0 - 100.0 fL   MCH 30.4 26.0 - 34.0 pg   MCHC 33.1 32.0 - 36.0 g/dL   RDW 78.414.1 69.611.5 - 29.514.5 %   Platelets 129 (L) 150 - 440 K/uL  Protein / creatinine ratio, urine   Collection Time: 03/22/17 12:34 PM  Result Value Ref Range   Creatinine, Urine 119 mg/dL   Total Protein, Urine 14 mg/dL   Protein Creatinine Ratio 0.12 0.00 - 0.15 mg/mg[Cre]     Gen: NAD, AAOx3      Abd: FNTTP      Ext:  Non-tender, Nonedmeatous  Pulm: CTAB CV: RRR Ext: 1+ reflexes bilateral LE, no clonus  FHT: 140, mod var, + accels, no decels TOCO: quiet SVE:  deferred  NST: Category I strip, see detailed evaluation above  A/P:  32 y.o. [id number] 2391w4d with superimposed gHTN on chronic HTN.                        Labor: not present.   R/o PreEclampsia: Labs normal except gestational thrombocytopenia  Precautions re: PreE given. S/p BMZ x2 on 1/11-1/12. Return to clinic in 48 hrs for BP check. Recommend rest and monitor sx. Return for dec fetal movement.  Fetal Wellbeing: Reassuring Cat 1 tracing.  D/c home stable, precautions reviewed, follow-up as scheduled.

## 2017-03-22 NOTE — Discharge Instructions (Signed)

## 2017-03-24 ENCOUNTER — Other Ambulatory Visit: Payer: Self-pay | Admitting: Obstetrics and Gynecology

## 2017-03-24 NOTE — Progress Notes (Signed)
Duplicate- H&P started in anticipation of IOL admission.

## 2017-03-25 LAB — OB RESULTS CONSOLE RPR: RPR: NONREACTIVE

## 2017-03-26 LAB — OB RESULTS CONSOLE GBS: STREP GROUP B AG: NEGATIVE

## 2017-03-29 ENCOUNTER — Inpatient Hospital Stay
Admission: EM | Admit: 2017-03-29 | Discharge: 2017-03-29 | Disposition: A | Discharge status: Home / Self Care | Payer: BLUE CROSS/BLUE SHIELD | Source: Home / Self Care | Admitting: Obstetrics and Gynecology

## 2017-03-29 ENCOUNTER — Encounter: Payer: Self-pay | Admitting: *Deleted

## 2017-03-29 DIAGNOSIS — O09893 Supervision of other high risk pregnancies, third trimester: Secondary | ICD-10-CM

## 2017-03-29 LAB — CBC
HCT: 36.6 % (ref 35.0–47.0)
Hemoglobin: 12.4 g/dL (ref 12.0–16.0)
MCH: 31 pg (ref 26.0–34.0)
MCHC: 33.9 g/dL (ref 32.0–36.0)
MCV: 91.2 fL (ref 80.0–100.0)
PLATELETS: 117 10*3/uL — AB (ref 150–440)
RBC: 4.01 MIL/uL (ref 3.80–5.20)
RDW: 14.1 % (ref 11.5–14.5)
WBC: 11.8 10*3/uL — AB (ref 3.6–11.0)

## 2017-03-29 LAB — COMPREHENSIVE METABOLIC PANEL
ALK PHOS: 146 U/L — AB (ref 38–126)
ALT: 12 U/L — AB (ref 14–54)
AST: 20 U/L (ref 15–41)
Albumin: 3.2 g/dL — ABNORMAL LOW (ref 3.5–5.0)
Anion gap: 10 (ref 5–15)
BUN: 12 mg/dL (ref 6–20)
CALCIUM: 9.2 mg/dL (ref 8.9–10.3)
CO2: 20 mmol/L — ABNORMAL LOW (ref 22–32)
Chloride: 104 mmol/L (ref 101–111)
Creatinine, Ser: 0.54 mg/dL (ref 0.44–1.00)
Glucose, Bld: 76 mg/dL (ref 65–99)
Potassium: 4.1 mmol/L (ref 3.5–5.1)
Sodium: 134 mmol/L — ABNORMAL LOW (ref 135–145)
Total Bilirubin: 0.7 mg/dL (ref 0.3–1.2)
Total Protein: 7 g/dL (ref 6.5–8.1)

## 2017-03-29 LAB — PROTEIN / CREATININE RATIO, URINE
CREATININE, URINE: 69 mg/dL
Protein Creatinine Ratio: 0.12 mg/mg{Cre} (ref 0.00–0.15)
Total Protein, Urine: 8 mg/dL

## 2017-03-29 MED ORDER — HYDRALAZINE HCL 20 MG/ML IJ SOLN: 10.0000 mg | Freq: Once | INTRAMUSCULAR | Status: DC | PRN

## 2017-03-29 MED ORDER — LABETALOL HCL 5 MG/ML IV SOLN: 20.0000 mg | INTRAVENOUS | Status: DC | PRN

## 2017-03-29 NOTE — Final Progress Note (Signed)
TRIAGE VISIT with NST   Vickie Hayes is a 32 y.o. [id number]. Vickie Hayes is at 6772w4d gestation.  Indication: Elevated Blood Pressure  S: Resting comfortably. no CTX, no VB. Active fetal movement. Denies headache, new SOB, new onset swelling, RUQ pain, visual changes. O:  BP 119/84   Pulse 86   Temp 98.3 F (36.8 C) (Oral)   Resp 14   Ht 5\' 3"  (1.6 m)   Wt 89.8 kg (198 lb)   LMP 08/13/2016 (Approximate)   SpO2 95%   BMI 35.07 kg/m  Results for orders placed or performed during the hospital encounter of 03/29/17 (from the past 48 hour(s))  Protein / creatinine ratio, urine   Collection Time: 03/29/17 12:25 PM  Result Value Ref Range   Creatinine, Urine 69 mg/dL   Total Protein, Urine 8 mg/dL   Protein Creatinine Ratio 0.12 0.00 - 0.15 mg/mg[Cre]  Comprehensive metabolic panel   Collection Time: 03/29/17 12:47 PM  Result Value Ref Range   Sodium 134 (L) 135 - 145 mmol/L   Potassium 4.1 3.5 - 5.1 mmol/L   Chloride 104 101 - 111 mmol/L   CO2 20 (L) 22 - 32 mmol/L   Glucose, Bld 76 65 - 99 mg/dL   BUN 12 6 - 20 mg/dL   Creatinine, Ser 1.470.54 0.44 - 1.00 mg/dL   Calcium 9.2 8.9 - 82.910.3 mg/dL   Total Protein 7.0 6.5 - 8.1 g/dL   Albumin 3.2 (L) 3.5 - 5.0 g/dL   AST 20 15 - 41 U/L   ALT 12 (L) 14 - 54 U/L   Alkaline Phosphatase 146 (H) 38 - 126 U/L   Total Bilirubin 0.7 0.3 - 1.2 mg/dL   GFR calc non Af Amer >60 >60 mL/min   GFR calc Af Amer >60 >60 mL/min   Anion gap 10 5 - 15  CBC   Collection Time: 03/29/17 12:47 PM  Result Value Ref Range   WBC 11.8 (H) 3.6 - 11.0 K/uL   RBC 4.01 3.80 - 5.20 MIL/uL   Hemoglobin 12.4 12.0 - 16.0 g/dL   HCT 56.236.6 13.035.0 - 86.547.0 %   MCV 91.2 80.0 - 100.0 fL   MCH 31.0 26.0 - 34.0 pg   MCHC 33.9 32.0 - 36.0 g/dL   RDW 78.414.1 69.611.5 - 29.514.5 %   Platelets 117 (L) 150 - 440 K/uL     Gen: NAD, AAOx3      Abd: FNTTP      Ext: Non-tender, 2+ edmeatous, reflexes 1+ Pulm: CTAB CV: RRR   FHT: mod var, +accels, no decels TOCO: quiet SVE:   deferred  NST: Category I strip, see detailed evaluation above  A/P:  32 y.o. [id number] 6472w4d with gestational HTN.                        Labor: not present.   R/o PreEclampsia: no proteinuria, LFTs wnl.   Gestational thrombocytopenia: other labs reassuring  Fetal Wellbeing: Reassuring Cat 1 tracing.  F/u on Friday for planned iol  Strict preE precautions reviewed with patient  Fetal activity  D/c home stable, precautions reviewed, follow-up as scheduled.

## 2017-03-29 NOTE — Discharge Instructions (Signed)

## 2017-03-29 NOTE — Progress Notes (Signed)
Pt discharged with plan for IOL Friday, she will call to make appointment for NST and BP check in the office Thursday. Discharge instructions, kick counts reviewed with pt.

## 2017-03-29 NOTE — OB Triage Provider Note (Signed)
TRIAGE VISIT with NST   Vickie Hayes is a 32 y.o. [id number]. She is at 7349w4d gestation.  Indication: Elevated Blood Pressure  S: Resting comfortably. no CTX, no VB. Active fetal movement. Denies headache, SOB, new onset swelling, RUQ pain, visual changes. O:  BP 118/84   Pulse 97   Temp 98.3 F (36.8 C) (Oral)   Resp 14   Ht 5\' 3"  (1.6 m)   Wt 89.8 kg (198 lb)   LMP 08/13/2016 (Approximate)   BMI 35.07 kg/m  No results found for this or any previous visit (from the past 48 hour(s)).   Gen: NAD, AAOx3      Abd: FNTTP      Ext: Non-tender, Nonedmeatous   FHT: 140, min to mod variability, +accels, no decels TOCO: q10 min SVE:  deferred  NST: Category I strip, see detailed evaluation above  A/P:  32 y.o. [id number] 5049w4d with elevated BP in the office.                        Labor: not present.   R/o PreEclampsia: Labs pending  Fetal monitoring

## 2017-03-29 NOTE — OB Triage Note (Signed)
[facility]

## 2017-03-31 ENCOUNTER — Encounter: Payer: Self-pay | Admitting: Anesthesiology

## 2017-03-31 ENCOUNTER — Other Ambulatory Visit: Payer: Self-pay

## 2017-03-31 ENCOUNTER — Inpatient Hospital Stay
Admission: EM | Admit: 2017-03-31 | Discharge: 2017-04-02 | DRG: 798 | Disposition: A | Discharge status: Home / Self Care | Payer: BLUE CROSS/BLUE SHIELD | Attending: Certified Nurse Midwife | Admitting: Certified Nurse Midwife

## 2017-03-31 DIAGNOSIS — O1002 Pre-existing essential hypertension complicating childbirth: Secondary | ICD-10-CM | POA: Diagnosis present

## 2017-03-31 DIAGNOSIS — Z302 Encounter for sterilization: Secondary | ICD-10-CM

## 2017-03-31 DIAGNOSIS — Z3A36 36 weeks gestation of pregnancy: Secondary | ICD-10-CM

## 2017-03-31 DIAGNOSIS — O133 Gestational [pregnancy-induced] hypertension without significant proteinuria, third trimester: Secondary | ICD-10-CM | POA: Diagnosis present

## 2017-03-31 LAB — CBC
HCT: 36.8 % (ref 35.0–47.0)
HCT: 38.2 % (ref 35.0–47.0)
Hemoglobin: 12.3 g/dL (ref 12.0–16.0)
Hemoglobin: 12.7 g/dL (ref 12.0–16.0)
MCH: 30.5 pg (ref 26.0–34.0)
MCH: 30.5 pg (ref 26.0–34.0)
MCHC: 33.4 g/dL (ref 32.0–36.0)
MCHC: 33.4 g/dL (ref 32.0–36.0)
MCV: 91.3 fL (ref 80.0–100.0)
MCV: 91.5 fL (ref 80.0–100.0)
PLATELETS: 114 10*3/uL — AB (ref 150–440)
PLATELETS: 119 10*3/uL — AB (ref 150–440)
RBC: 4.03 MIL/uL (ref 3.80–5.20)
RBC: 4.18 MIL/uL (ref 3.80–5.20)
RDW: 14.1 % (ref 11.5–14.5)
RDW: 14.3 % (ref 11.5–14.5)
WBC: 13.5 10*3/uL — ABNORMAL HIGH (ref 3.6–11.0)
WBC: 18.2 10*3/uL — AB (ref 3.6–11.0)

## 2017-03-31 LAB — COMPREHENSIVE METABOLIC PANEL
ALT: 12 U/L — AB (ref 14–54)
ANION GAP: 8 (ref 5–15)
AST: 23 U/L (ref 15–41)
Albumin: 3.1 g/dL — ABNORMAL LOW (ref 3.5–5.0)
Alkaline Phosphatase: 156 U/L — ABNORMAL HIGH (ref 38–126)
BUN: 11 mg/dL (ref 6–20)
CHLORIDE: 103 mmol/L (ref 101–111)
CO2: 22 mmol/L (ref 22–32)
Calcium: 9.3 mg/dL (ref 8.9–10.3)
Creatinine, Ser: 0.66 mg/dL (ref 0.44–1.00)
GFR calc Af Amer: >60 mL/min (ref >60)
GFR calc non Af Amer: >60 mL/min (ref >60)
Glucose, Bld: 152 mg/dL — ABNORMAL HIGH (ref 65–99)
POTASSIUM: 4.2 mmol/L (ref 3.5–5.1)
SODIUM: 133 mmol/L — AB (ref 135–145)
Total Bilirubin: 0.4 mg/dL (ref 0.3–1.2)
Total Protein: 6.9 g/dL (ref 6.5–8.1)

## 2017-03-31 LAB — PROTEIN / CREATININE RATIO, URINE
Creatinine, Urine: 132 mg/dL
Protein Creatinine Ratio: 0.16 mg/mg{Cre} — ABNORMAL HIGH (ref 0.00–0.15)
TOTAL PROTEIN, URINE: 21 mg/dL

## 2017-03-31 LAB — TYPE AND SCREEN
ABO/RH(D): A POS
ANTIBODY SCREEN: NEGATIVE

## 2017-03-31 MED ORDER — IBUPROFEN 600 MG PO TABS
600.0000 mg | ORAL_TABLET | Freq: Four times a day (QID) | ORAL | Status: DC
  Administered 2017-03-31 – 2017-04-02 (×4): 600 mg via ORAL
  Filled 2017-03-31 (×5): qty 1

## 2017-03-31 MED ORDER — BENZOCAINE-MENTHOL 20-0.5 % EX AERO
INHALATION_SPRAY | CUTANEOUS | Status: AC
  Administered 2017-04-01: 02:00:00
  Filled 2017-03-31: qty 56

## 2017-03-31 MED ORDER — FENTANYL 2.5 MCG/ML W/ROPIVACAINE 0.15% IN NS 100 ML EPIDURAL (ARMC): 12.0000 mL/h | EPIDURAL | Status: DC

## 2017-03-31 MED ORDER — PRENATAL MULTIVITAMIN CH: 1.0000 | ORAL_TABLET | Freq: Every day | ORAL | Status: DC

## 2017-03-31 MED ORDER — LIDOCAINE HCL (PF) 1 % IJ SOLN
INTRAMUSCULAR | Status: AC
  Filled 2017-03-31: qty 30

## 2017-03-31 MED ORDER — PENICILLIN G POTASSIUM 5000000 UNITS IJ SOLR
5.0000 10*6.[IU] | Freq: Once | INTRAVENOUS | Status: AC
  Administered 2017-03-31: 5 10*6.[IU] via INTRAVENOUS
  Filled 2017-03-31: qty 5

## 2017-03-31 MED ORDER — TERBUTALINE SULFATE 1 MG/ML IJ SOLN: 0.2500 mg | Freq: Once | INTRAMUSCULAR | Status: DC | PRN

## 2017-03-31 MED ORDER — SENNOSIDES-DOCUSATE SODIUM 8.6-50 MG PO TABS
2.0000 | ORAL_TABLET | ORAL | Status: DC
  Administered 2017-04-02: 2 via ORAL
  Filled 2017-03-31 (×2): qty 2

## 2017-03-31 MED ORDER — ONDANSETRON HCL 4 MG PO TABS: 4.0000 mg | ORAL_TABLET | ORAL | Status: DC | PRN

## 2017-03-31 MED ORDER — LIDOCAINE HCL (PF) 1 % IJ SOLN: 30.0000 mL | INTRAMUSCULAR | Status: DC | PRN

## 2017-03-31 MED ORDER — WITCH HAZEL-GLYCERIN EX PADS: 1.0000 "application " | MEDICATED_PAD | CUTANEOUS | Status: DC | PRN

## 2017-03-31 MED ORDER — DIPHENHYDRAMINE HCL 25 MG PO CAPS: 25.0000 mg | ORAL_CAPSULE | Freq: Four times a day (QID) | ORAL | Status: DC | PRN

## 2017-03-31 MED ORDER — MISOPROSTOL 25 MCG QUARTER TABLET
25.0000 ug | ORAL_TABLET | Freq: Once | ORAL | Status: AC
  Administered 2017-03-31: 25 ug via VAGINAL

## 2017-03-31 MED ORDER — EPHEDRINE 5 MG/ML INJ: 10.0000 mg | INTRAVENOUS | Status: DC | PRN

## 2017-03-31 MED ORDER — DIBUCAINE 1 % RE OINT: 1.0000 "application " | TOPICAL_OINTMENT | RECTAL | Status: DC | PRN

## 2017-03-31 MED ORDER — BENZOCAINE-MENTHOL 20-0.5 % EX AERO: 1.0000 "application " | INHALATION_SPRAY | CUTANEOUS | Status: DC | PRN

## 2017-03-31 MED ORDER — ACETAMINOPHEN 325 MG PO TABS: 650.0000 mg | ORAL_TABLET | ORAL | Status: DC | PRN

## 2017-03-31 MED ORDER — DIPHENHYDRAMINE HCL 25 MG PO CAPS: 50.0000 mg | ORAL_CAPSULE | Freq: Once | ORAL | Status: DC | PRN

## 2017-03-31 MED ORDER — SOD CITRATE-CITRIC ACID 500-334 MG/5ML PO SOLN: 30.0000 mL | ORAL | Status: DC | PRN

## 2017-03-31 MED ORDER — ZOLPIDEM TARTRATE 5 MG PO TABS: 5.0000 mg | ORAL_TABLET | Freq: Every evening | ORAL | Status: DC | PRN

## 2017-03-31 MED ORDER — ONDANSETRON HCL 4 MG/2ML IJ SOLN: 4.0000 mg | Freq: Four times a day (QID) | INTRAMUSCULAR | Status: DC | PRN

## 2017-03-31 MED ORDER — ONDANSETRON HCL 4 MG/2ML IJ SOLN
4.0000 mg | INTRAMUSCULAR | Status: DC | PRN
  Administered 2017-04-01: 4 mg via INTRAVENOUS
  Filled 2017-03-31: qty 2

## 2017-03-31 MED ORDER — OXYTOCIN 10 UNIT/ML IJ SOLN
INTRAMUSCULAR | Status: AC
  Filled 2017-03-31: qty 2

## 2017-03-31 MED ORDER — LACTATED RINGERS IV SOLN
INTRAVENOUS | Status: DC
  Administered 2017-03-31 – 2017-04-01 (×3): via INTRAVENOUS

## 2017-03-31 MED ORDER — LABETALOL HCL 5 MG/ML IV SOLN
20.0000 mg | INTRAVENOUS | Status: DC | PRN
  Filled 2017-03-31: qty 4

## 2017-03-31 MED ORDER — LACTATED RINGERS IV SOLN: 500.0000 mL | Freq: Once | INTRAVENOUS | Status: DC

## 2017-03-31 MED ORDER — DIPHENHYDRAMINE HCL 50 MG/ML IJ SOLN: 12.5000 mg | INTRAMUSCULAR | Status: DC | PRN

## 2017-03-31 MED ORDER — MORPHINE SULFATE (PF) 4 MG/ML IV SOLN
INTRAVENOUS | Status: AC
  Administered 2017-03-31: 2 mg
  Filled 2017-03-31: qty 1

## 2017-03-31 MED ORDER — MISOPROSTOL 25 MCG QUARTER TABLET
25.0000 ug | ORAL_TABLET | Freq: Once | ORAL | Status: AC
  Administered 2017-03-31: 25 ug via BUCCAL
  Filled 2017-03-31: qty 1

## 2017-03-31 MED ORDER — OXYTOCIN BOLUS FROM INFUSION
500.0000 mL | Freq: Once | INTRAVENOUS | Status: AC
  Administered 2017-03-31: 500 mL via INTRAVENOUS

## 2017-03-31 MED ORDER — ACETAMINOPHEN 325 MG PO TABS
650.0000 mg | ORAL_TABLET | ORAL | Status: DC | PRN
  Administered 2017-04-01 – 2017-04-02 (×4): 650 mg via ORAL
  Filled 2017-03-31 (×4): qty 2

## 2017-03-31 MED ORDER — PHENYLEPHRINE 40 MCG/ML (10ML) SYRINGE FOR IV PUSH (FOR BLOOD PRESSURE SUPPORT): 80.0000 ug | PREFILLED_SYRINGE | INTRAVENOUS | Status: DC | PRN

## 2017-03-31 MED ORDER — AMMONIA AROMATIC IN INHA
RESPIRATORY_TRACT | Status: AC
  Filled 2017-03-31: qty 10

## 2017-03-31 MED ORDER — BUTORPHANOL TARTRATE 1 MG/ML IJ SOLN
1.0000 mg | INTRAMUSCULAR | Status: DC | PRN
  Administered 2017-03-31 (×2): 1 mg via INTRAVENOUS
  Filled 2017-03-31 (×2): qty 1

## 2017-03-31 MED ORDER — LACTATED RINGERS IV SOLN: 500.0000 mL | INTRAVENOUS | Status: DC | PRN

## 2017-03-31 MED ORDER — COCONUT OIL OIL: 1.0000 "application " | TOPICAL_OIL | Status: DC | PRN

## 2017-03-31 MED ORDER — MISOPROSTOL 200 MCG PO TABS
ORAL_TABLET | ORAL | Status: AC
  Filled 2017-03-31: qty 4

## 2017-03-31 MED ORDER — FENTANYL 2.5 MCG/ML W/ROPIVACAINE 0.15% IN NS 100 ML EPIDURAL (ARMC)
EPIDURAL | Status: AC
  Filled 2017-03-31: qty 100

## 2017-03-31 MED ORDER — OXYTOCIN 40 UNITS IN LACTATED RINGERS INFUSION - SIMPLE MED
1.0000 m[IU]/min | INTRAVENOUS | Status: DC
  Filled 2017-03-31: qty 1000

## 2017-03-31 MED ORDER — MORPHINE SULFATE (PF) 2 MG/ML IV SOLN: 2.0000 mg | Freq: Once | INTRAVENOUS | Status: DC

## 2017-03-31 MED ORDER — SIMETHICONE 80 MG PO CHEW
80.0000 mg | CHEWABLE_TABLET | ORAL | Status: DC | PRN
  Administered 2017-04-02: 80 mg via ORAL
  Filled 2017-03-31: qty 1

## 2017-03-31 MED ORDER — PENICILLIN G POT IN DEXTROSE 60000 UNIT/ML IV SOLN
3.0000 10*6.[IU] | INTRAVENOUS | Status: DC
  Filled 2017-03-31 (×3): qty 50

## 2017-03-31 MED ORDER — OXYTOCIN 40 UNITS IN LACTATED RINGERS INFUSION - SIMPLE MED: 2.5000 [IU]/h | INTRAVENOUS | Status: DC

## 2017-03-31 NOTE — Progress Notes (Signed)
Labor Progress Note  Vickie Hayes is a 32 y.o. [id number] at 636w6d by 5935w6d ultrasound admitted for induction of labor due to Hypertension.  Subjective:  Pt sitting up in bed, eyed closed and focusing on working through contractions. She reports that contractions are getting stronger. One dose of IV Stadol made her sleepy but did not too much to help with the pain.   Objective: BP 140/85   Pulse 89   Temp 98.4 F (36.9 C) (Oral)   Resp 16   Ht 5\' 3"  (1.6 m)   Wt 89.8 kg (198 lb)   LMP 08/13/2016 (Approximate)   BMI 35.07 kg/m  Notable VS details: normal to mild range BPs  Fetal Assessment: FHT:  FHR: 140 bpm, variability: moderate,  accelerations:  Present,  decelerations:  Present non-recurrent variables Category/reactivity:  Category II UC:   regular, every 2-3 minutes SVE:  Per RN Joy Wagoner at 1800 Dilation: 4cm  Effacement: 90%  Station:  -3  Membrane status: intact  Assessment / Plan:  Induction of labor due to hypertension,  progressing normally  IOL: Progressing normally, s/p 1 dose misoprostol 25mcg PV and 1 dose misoprostol 25mcg buccally Hypertension: normal to mild range BPs, labs WNL, no S/S of severe features of preeclampsia  Fetal Wellbeing:  Category II Pain Control:  s/p one dose of IV Stadol, desired epidural Anticipated MOD:  NSVD  Vickie Hayes, CNM 03/31/2017, 6:16 PM

## 2017-03-31 NOTE — H&P (Signed)
OB History & Physical   History of Present Illness:  Chief Complaint:   HPI:  Vickie Hayes is a 32 y.o. 573P2002 female at 6937w6d dated by 4156w6d ultrasound.  She presents to L&D for AOL in the setting of chronic hypertension with an elevated blood pressure in the office today and reports of contractions since last night.   She reports:  -active fetal movement -no leakage of fluid  -no vaginal bleeding -onset of contractions around midnight, every 6-8 minutes this morning, but have spaced out since arriving to L&D  Pregnancy Issues: 1. Chronic hypertension, not on meds 2. Impaired glucose tolerance  Maternal Medical History:   Past Medical History:  Diagnosis Date  . Bacterial vaginosis   . Gestational diabetes    pt denies  . History of Papanicolaou smear of cervix 03/23/2013;10102017   NEG; NEG  . Hypertension   . Medical history non-contributory   . Oligomenorrhea    OFF BC    Past Surgical History:  Procedure Laterality Date  . NEXPLANON INSERTION  04/23/2013  . NEXPLANON REMOVAL  10/2013   [facility]  . WISDOM TOOTH EXTRACTION Bilateral     No Known Allergies  Prior to Admission medications   Medication Sig Start Date End Date Taking? Authorizing Provider  Prenatal Vit-Fe Fumarate-FA (PRENATAL MULTIVITAMIN) TABS tablet Take 1 tablet by mouth daily at 12 noon.    [provider]     [facility]  Social History: She  reports that  has never smoked. she has never used smokeless tobacco. She reports that she does not drink alcohol or use drugs.  Family History: family history includes Cancer in her maternal grandmother; Diabetes in her maternal grandmother; HIV in her mother; Hypertension in her maternal grandmother and mother; Tuberculosis in her mother.   Review of Systems: A full review of systems was performed and negative except as noted in the HPI.    Physical Exam:  Vital Signs: BP 136/80   Pulse (!) 109   Temp  98.4 F (36.9 C) (Oral)   Resp 16   Ht 5\' 3"  (1.6 m)   Wt 89.8 kg (198 lb)   LMP 08/13/2016 (Approximate)   BMI 35.07 kg/m   General:   alert, cooperative and appears stated age  Skin:  normal and no rash or abnormalities  Neurologic:    Alert & oriented x 3  Lungs:   clear to auscultation bilaterally  Heart:   regular rate and rhythm, S1, S2 normal, no murmur, click, rub or gallop  Abdomen:  soft, non-tender; bowel sounds normal; no masses,  no organomegaly  FHT:  150 BPM  Presentations: cephalic  Cervix:  3cm/80%/-3 posterior per RN exam at 1130  Extremities: : non-tender, symmetric, no edema bilaterally.     EFW: 7lbs; growth scan on 1/15 with EFW of 2727g  Results for orders placed or performed during the hospital encounter of 03/31/17 (from the past 24 hour(s))  CBC     Status: Abnormal   Collection Time: 03/31/17 10:33 AM  Result Value Ref Range   WBC 13.5 (H) 3.6 - 11.0 K/uL   RBC 4.03 3.80 - 5.20 MIL/uL   Hemoglobin 12.3 12.0 - 16.0 g/dL   HCT 47.836.8 29.535.0 - 62.147.0 %   MCV 91.3 80.0 - 100.0 fL   MCH 30.5 26.0 - 34.0 pg   MCHC 33.4 32.0 - 36.0 g/dL   RDW 30.814.1 65.711.5 - 84.614.5 %   Platelets 114 (L)  150 - 440 K/uL  Comprehensive metabolic panel     Status: Abnormal   Collection Time: 03/31/17 10:33 AM  Result Value Ref Range   Sodium 133 (L) 135 - 145 mmol/L   Potassium 4.2 3.5 - 5.1 mmol/L   Chloride 103 101 - 111 mmol/L   CO2 22 22 - 32 mmol/L   Glucose, Bld 152 (H) 65 - 99 mg/dL   BUN 11 6 - 20 mg/dL   Creatinine, Ser 1.61 0.44 - 1.00 mg/dL   Calcium 9.3 8.9 - 09.6 mg/dL   Total Protein 6.9 6.5 - 8.1 g/dL   Albumin 3.1 (L) 3.5 - 5.0 g/dL   AST 23 15 - 41 U/L   ALT 12 (L) 14 - 54 U/L   Alkaline Phosphatase 156 (H) 38 - 126 U/L   Total Bilirubin 0.4 0.3 - 1.2 mg/dL   GFR calc non Af Amer >60 >60 mL/min   GFR calc Af Amer >60 >60 mL/min   Anion gap 8 5 - 15  Type and screen     Status: None   Collection Time: 03/31/17 10:33 AM  Result Value Ref Range   ABO/RH(D) A  POS    Antibody Screen NEG    Sample Expiration      04/03/2017 Performed at [facility], [address] [place], [place] [address]   Protein / creatinine ratio, urine     Status: Abnormal   Collection Time: 03/31/17 11:31 AM  Result Value Ref Range   Creatinine, Urine 132 mg/dL   Total Protein, Urine 21 mg/dL   Protein Creatinine Ratio 0.16 (H) 0.00 - 0.15 mg/mg[Cre]    Pertinent Results:  Prenatal Labs: Blood type/Rh A+  Antibody screen neg  Rubella Immune  Varicella Immune  RPR NR  HBsAg Neg  HIV NR  GC neg  Chlamydia neg  Genetic screening negative  1 hour GTT 172  3 hour GTT 81, 148, 176, 123  GBS negative   FHT: FHR: 150 bpm, variability: moderate,  accelerations:  Present,  decelerations:  Present one early, occasional variable Category/reactivity:  Category II TOCO: irregular, every 10-12 minutes SVE: Dilation: 3 / Effacement (%): 80 / Station: -3    Cephalic by leopolds  US Ob Limited  Result Date: 03/19/2017 CLINICAL DATA:  Hypertension, third trimester pregnancy EXAM: LIMITED OBSTETRIC ULTRASOUND FINDINGS: Number of Fetuses: 1 Heart Rate:  143 bpm Movement: Visible Presentation: Cephalic Placental Location: Lateral, left.  Grade 3 placenta Previa: Not evaluated Amniotic Fluid (Subjective):  Within normal limits. AFI: 17.9 cm BPD:  8.7cm 35w 1d MATERNAL FINDINGS: Cervix:  Not evaluated Uterus/Adnexae: Not evaluated IMPRESSION: Single viable intrauterine pregnancy with measurements as above. AFI within normal limits. Grade 3 placenta This exam is performed on an emergent basis and does not comprehensively evaluate fetal size, dating, or anatomy; follow-up complete OB US should be considered if further fetal assessment is warranted. Electronically Signed   By: Jasmine Pang M.D.   On: 03/19/2017 00:22    Assessment:  Vickie Hayes is a 32 y.o. G16P2002 female at [redacted]w[redacted]d with chronic hypertesion.   Plan:  1. Admit to Labor & Delivery; consents reviewed  and obtained  2. Fetal Well being  - Fetal Tracing: category II for occasional variable deceleration and period of minimal variability, overall reassuring with moderate variability and 15x15 accerlations - GBS neg - Presentation: vertex confirmed by leopolds   3. Routine OB: - Prenatal labs reviewed, as above - Rh pos - CBC & T&S  on admit - Clear fluids, IVF  4. Augmentation of Labor -  Contractions occasional with external toco in place -  Pelvis proven to 6lb 13oz -  Plan for augmentation with misoprostol vaginally and buccally, will evaluate after 4 hours -  Plan for continuous fetal monitoring  -  Maternal pain control as desired: IVPM, nitrous, regional anesthesia - Anticipate vaginal delivery  5. Post Partum Planning: - Infant feeding: breastfeeding - Contraception: BTL, consents signed on 11/11/16  Genia Del, CNM 03/31/2017 2:18 PM ----- Genia Del Certified Nurse Midwife [facility], Department of OB/GYN [facility]

## 2017-03-31 NOTE — Anesthesia Preprocedure Evaluation (Deleted)
Anesthesia Evaluation  Patient identified by MRN, date of birth, ID band Patient awake    Reviewed: Allergy & Precautions, H&P , NPO status , Patient's Chart, lab work & pertinent test results, reviewed documented beta blocker date and time   History of Anesthesia Complications Negative for: history of anesthetic complications  Airway Mallampati: II  TM Distance: >3 FB Neck ROM: full    Dental   Pulmonary neg pulmonary ROS,           Cardiovascular Exercise Tolerance: Good hypertension,      Neuro/Psych negative neurological ROS  negative psych ROS   GI/Hepatic negative GI ROS, Neg liver ROS,   Endo/Other  diabetes, Gestational  Renal/GU negative Renal ROS  negative genitourinary   Musculoskeletal   Abdominal   Peds  Hematology negative hematology ROS (+)   Anesthesia Other Findings Past Medical History: No date: Bacterial vaginosis No date: Gestational diabetes     Comment:  pt denies 03/23/2013;[id number]: History of Papanicolaou smear of cervix     Comment:  NEG; NEG No date: Hypertension No date: Medical history non-contributory No date: Oligomenorrhea     Comment:  OFF BC   Reproductive/Obstetrics (+) Pregnancy                             Anesthesia Physical Anesthesia Plan  ASA: III  Anesthesia Plan: Epidural   Post-op Pain Management:    Induction:   PONV Risk Score and Plan:   Airway Management Planned:   Additional Equipment:   Intra-op Plan:   Post-operative Plan:   Informed Consent: I have reviewed the patients History and Physical, chart, labs and discussed the procedure including the risks, benefits and alternatives for the proposed anesthesia with the patient or authorized representative who has indicated his/her understanding and acceptance.     Plan Discussed with: Anesthesiologist, CRNA and Surgeon  Anesthesia Plan Comments:         Anesthesia  Quick Evaluation

## 2017-03-31 NOTE — Progress Notes (Signed)
Labor Progress Note  Vickie Hayes is a 32 y.o. [id number] at 4333w6d by 1018w6d ultrasound admitted for induction of labor due to hypertension.  Subjective: Patient in left lateral position in bed. Eyes closed with minimal verbal response to questions; appears to be very focused on coping with contractions. Follows directions.   Objective: BP (!) 156/103 (BP Location: Left Arm)   Pulse 89   Temp 98.4 F (36.9 C) (Oral)   Resp 16   Ht 5\' 3"  (1.6 m)   Wt 89.8 kg (198 lb)   LMP 08/13/2016 (Approximate)   BMI 35.07 kg/m  Notable VS details: mild range  Fetal Assessment: FHT:  FHR: 140 bpm, variability: minimal to moderate,  accelerations:  Present,  decelerations:  Absent Category/reactivity:  Category II UC:   regular, every 2 minutes SVE:    Dilation: 5.5cm  Effacement: 90%  Station:  -3  Consistency: soft  Position: anterior  Membrane status: intact  Assessment / Plan: Induction of labor due to hypertension,  progressing normally  IOL: Progressing normally, s/p 1 dose misoprostol 25mcg PV and 1 dose misoprostol 25mcg buccally Hypertension: mild range BPs, labs WNL, no S/S of severe features of preeclampsia  Fetal Wellbeing:  Category II for minimal variability after Stadol administration, periods of moderate variability with response to fetal scalp stimulation Pain Control:  s/p two doses of IV Stadol; waiting for epidural placement Anticipated MOD:  NSVD  Discussed patient status and progress with Dr. Leeroy Bockhelsea Ward.   Genia DelMargaret Joselle Deeds, CNM 03/31/2017, 8:41 PM

## 2017-03-31 NOTE — Plan of Care (Signed)
Scheduled IOL for gestational hypertension complicated by possible chronic hypertension.  Cytotec given per CNM order.  Pain controlled with prn IV pain medication x1.  Regular contractions 2-3 minutes apart.  Category 1 FHR tracing.

## 2017-03-31 NOTE — Discharge Summary (Signed)
Obstetric Discharge Summary   Patient ID: Patient Name: Vickie Hayes DOB: 10/19/1985 MRN: [id number]  Date of Admission: 03/31/2017 Date of Delivery: 03/31/2017 Delivered by: Genia Del, CNM Date of Discharge: 04/02/2017  Primary OB: [facility] [id number] last menstrual period was 08/13/2016 (approximate). EDC Estimated Date of Delivery: 04/22/17 Gestational Age at Delivery: [redacted]w[redacted]d   Antepartum complications: chronic hypertension, not on meds; impaired glucose tolerance Admitting Diagnosis: early labor, elevated BP in the office  Secondary Diagnoses: Patient Active Problem List   Diagnosis Date Noted  . Gestational hypertension w/o significant proteinuria in 3rd trimester 03/31/2017  . Chronic hypertension affecting pregnancy 03/18/2017  . Supervision of high risk pregnancy in third trimester 09/16/2016    Augmentation: Misoprostol Complications: None Intrapartum complications/course: She was given simultaneous doses of buccal and vaginal misoprostol. IV Stadol was given for pain relief. She had SROM for bloody fluid around 2100, quickly progressed to complete. FHR tracing category II during second stage for deep variables which recovered to a baseline around 140bpm with accelerations during pushing. She had a spontaneous vaginal birth of a live female over an intact perineum without anesthesia at 2118. The fetal head was delivered with restitution to ROT. Anterior then posterior shoulders delivered spontaneously. Baby placed on mom's chest and attended to by transition RN. Cord clamped and cut when pulseless. Cord blood obtained for newborn labs. Placenta delivered spontaneously and intact with 3 cord vessels. IV pitocin given for hemorrhage prophylaxis. EBL 350 mL. Delivery Type: spontaneous vaginal delivery Anesthesia: none Placenta: sponatneous Laceration: none Episiotomy: none  Newborn Data: Live born female  Birth Weight: 6 lb 11.2 oz (3040 g) APGAR: 8,  9  Newborn Delivery   Birth date/time:  03/31/2017 21:18:00 Delivery type:  Vaginal, Spontaneous     Postpartum Course  Patient had an uncomplicated postpartum course.  On PPD 1 she had a tubal ligation without complication.  By time of discharge on PPD#2, her pain was controlled on oral pain medications; she had appropriate lochia and was ambulating, voiding without difficulty and tolerating regular diet.  She was deemed stable for discharge to home.       Labs: CBC Latest Ref Rng & Units 04/01/2017 03/31/2017 03/31/2017  WBC 3.6 - 11.0 K/uL 19.3(H) 18.2(H) 13.5(H)  Hemoglobin 12.0 - 16.0 g/dL 11.2(L) 12.7 12.3  Hematocrit 35.0 - 47.0 % 33.9(L) 38.2 36.8  Platelets 150 - 440 K/uL 110(L) 119(L) 114(L)   A POS  Physical exam:  BP 140/83 (BP Location: Left Arm)   Pulse (!) 106   Temp 98.3 F (36.8 C) (Oral)   Resp 20   Ht 5\' 3"  (1.6 m)   Wt 198 lb (89.8 kg)   LMP 08/13/2016 (Approximate)   SpO2 97%   Breastfeeding? Unknown   BMI 35.07 kg/m  General: alert and no distress Pulm: normal respiratory effort Lochia: appropriate Abdomen: soft, NT Uterine Fundus: firm, below umbilicus Periumbilical Incision: c/d/i, healing well, no significant drainage, no dehiscence, no significant erythema Extremities: No evidence of DVT seen on physical exam. No lower extremity edema.   Disposition: stable, discharge to home Baby Feeding: formula Baby Disposition: home with mom  Contraception: BTL  Prenatal Labs:  Blood type/Rh A+  Antibody screen neg  Rubella Immune  Varicella Immune  RPR NR  HBsAg Neg  HIV NR  GC neg  Chlamydia neg  Genetic screening negative  1 hour GTT 172  3 hour GTT 81, 148, 176, 123  GBS negative     Rh Immune globulin  given: n/a  Plan:  Vickie Hayes was discharged to home in good condition. [facility] with delivery provider in 2 weeks  Discharge Instructions: Per After Visit Summary. Activity: Advance as  tolerated. Pelvic rest for 6 weeks.   Diet: Regular Discharge Medications: Allergies as of 04/02/2017   No Known Allergies     Medication List    TAKE these medications   acetaminophen 325 MG tablet Commonly known as:  TYLENOL Take 2 tablets (650 mg total) by mouth every 6 (six) hours as needed (for pain scale < 4).   dibucaine 1 % Oint Commonly known as:  NUPERCAINAL Place 1 application rectally as needed for hemorrhoids.   ibuprofen 600 MG tablet Commonly known as:  ADVIL,MOTRIN Take 1 tablet (600 mg total) by mouth every 6 (six) hours.   prenatal multivitamin Tabs tablet Take 1 tablet by mouth daily at 12 noon.   senna-docusate 8.6-50 MG tablet Commonly known as:  Senokot-S Take 2 tablets by mouth at bedtime as needed for mild constipation.   simethicone 80 MG chewable tablet Commonly known as:  MYLICON Chew 1 tablet (80 mg total) by mouth as needed for flatulence.   witch hazel-glycerin pad Commonly known as:  TUCKS Apply 1 application topically as needed for hemorrhoids.      Outpatient follow up:  Follow-up Information    Genia DelHaviland, Margaret, CNM. Schedule an appointment as soon as possible for a visit in 6 week(s).   Specialty:  Certified Nurse Midwife Why:  Make appointment for routine postpartum visit.  Contact information: [address] [place] [id number] [phone]           Signed:  Heloise OchoaMcVey, REBECCA A  04/02/17  9:22 AM

## 2017-04-01 ENCOUNTER — Inpatient Hospital Stay: Payer: BLUE CROSS/BLUE SHIELD | Admitting: Anesthesiology

## 2017-04-01 ENCOUNTER — Encounter
Admission: EM | Disposition: A | Discharge status: Home / Self Care | Payer: Self-pay | Source: Home / Self Care | Attending: Certified Nurse Midwife

## 2017-04-01 ENCOUNTER — Encounter: Payer: Self-pay | Admitting: Anesthesiology

## 2017-04-01 HISTORY — PX: TUBAL LIGATION: SHX77

## 2017-04-01 LAB — CBC
HCT: 33.9 % — ABNORMAL LOW (ref 35.0–47.0)
Hemoglobin: 11.2 g/dL — ABNORMAL LOW (ref 12.0–16.0)
MCH: 30.2 pg (ref 26.0–34.0)
MCHC: 33 g/dL (ref 32.0–36.0)
MCV: 91.6 fL (ref 80.0–100.0)
PLATELETS: 110 10*3/uL — AB (ref 150–440)
RBC: 3.7 MIL/uL — ABNORMAL LOW (ref 3.80–5.20)
RDW: 14.1 % (ref 11.5–14.5)
WBC: 19.3 10*3/uL — ABNORMAL HIGH (ref 3.6–11.0)

## 2017-04-01 LAB — COMPREHENSIVE METABOLIC PANEL
ALBUMIN: 2.8 g/dL — AB (ref 3.5–5.0)
ALT: 12 U/L — ABNORMAL LOW (ref 14–54)
ANION GAP: 8 (ref 5–15)
AST: 24 U/L (ref 15–41)
Alkaline Phosphatase: 111 U/L (ref 38–126)
BUN: 9 mg/dL (ref 6–20)
CHLORIDE: 103 mmol/L (ref 101–111)
CO2: 21 mmol/L — AB (ref 22–32)
Calcium: 9 mg/dL (ref 8.9–10.3)
Creatinine, Ser: 0.5 mg/dL (ref 0.44–1.00)
GFR calc Af Amer: >60 mL/min (ref >60)
GFR calc non Af Amer: >60 mL/min (ref >60)
GLUCOSE: 110 mg/dL — AB (ref 65–99)
POTASSIUM: 4.2 mmol/L (ref 3.5–5.1)
SODIUM: 132 mmol/L — AB (ref 135–145)
Total Bilirubin: 0.7 mg/dL (ref 0.3–1.2)
Total Protein: 5.8 g/dL — ABNORMAL LOW (ref 6.5–8.1)

## 2017-04-01 LAB — PROTEIN / CREATININE RATIO, URINE
Creatinine, Urine: 58 mg/dL
PROTEIN CREATININE RATIO: 0.36 mg/mg{creat} — AB (ref 0.00–0.15)
Total Protein, Urine: 21 mg/dL

## 2017-04-01 LAB — GLUCOSE, CAPILLARY
GLUCOSE-CAPILLARY: 53 mg/dL — AB (ref 65–99)
Glucose-Capillary: 86 mg/dL (ref 65–99)

## 2017-04-01 LAB — RPR: RPR: NONREACTIVE

## 2017-04-01 SURGERY — LIGATION, FALLOPIAN TUBE, POSTPARTUM
Anesthesia: General | Laterality: Bilateral | Wound class: Clean

## 2017-04-01 MED ORDER — ACETAMINOPHEN 10 MG/ML IV SOLN
INTRAVENOUS | Status: DC | PRN
  Administered 2017-04-01: 1000 mg via INTRAVENOUS

## 2017-04-01 MED ORDER — HYDRALAZINE HCL 20 MG/ML IJ SOLN
10.0000 mg | Freq: Once | INTRAMUSCULAR | Status: AC
  Administered 2017-04-01: 10 mg via INTRAVENOUS

## 2017-04-01 MED ORDER — FENTANYL CITRATE (PF) 100 MCG/2ML IJ SOLN
INTRAMUSCULAR | Status: AC
  Filled 2017-04-01: qty 2

## 2017-04-01 MED ORDER — LABETALOL HCL 5 MG/ML IV SOLN
10.0000 mg | Freq: Once | INTRAVENOUS | Status: AC
  Administered 2017-04-01: 10 mg via INTRAVENOUS

## 2017-04-01 MED ORDER — FENTANYL CITRATE (PF) 100 MCG/2ML IJ SOLN
INTRAMUSCULAR | Status: DC | PRN
  Administered 2017-04-01 (×2): 25 ug via INTRAVENOUS
  Administered 2017-04-01 (×3): 50 ug via INTRAVENOUS

## 2017-04-01 MED ORDER — MIDAZOLAM HCL 2 MG/2ML IJ SOLN
INTRAMUSCULAR | Status: AC
  Filled 2017-04-01: qty 2

## 2017-04-01 MED ORDER — SUCCINYLCHOLINE CHLORIDE 20 MG/ML IJ SOLN
INTRAMUSCULAR | Status: AC
  Filled 2017-04-01: qty 1

## 2017-04-01 MED ORDER — SUCCINYLCHOLINE CHLORIDE 20 MG/ML IJ SOLN
INTRAMUSCULAR | Status: DC | PRN
  Administered 2017-04-01: 100 mg via INTRAVENOUS

## 2017-04-01 MED ORDER — ACETAMINOPHEN 10 MG/ML IV SOLN
INTRAVENOUS | Status: AC
  Filled 2017-04-01: qty 100

## 2017-04-01 MED ORDER — DEXAMETHASONE SODIUM PHOSPHATE 10 MG/ML IJ SOLN
INTRAMUSCULAR | Status: AC
Start: 2017-04-01 — End: 2017-04-01
  Filled 2017-04-01: qty 1

## 2017-04-01 MED ORDER — FENTANYL CITRATE (PF) 100 MCG/2ML IJ SOLN
INTRAMUSCULAR | Status: AC
  Administered 2017-04-01: 25 ug via INTRAVENOUS
  Filled 2017-04-01: qty 2

## 2017-04-01 MED ORDER — MIDAZOLAM HCL 2 MG/2ML IJ SOLN
INTRAMUSCULAR | Status: DC | PRN
  Administered 2017-04-01: 2 mg via INTRAVENOUS

## 2017-04-01 MED ORDER — FENTANYL CITRATE (PF) 100 MCG/2ML IJ SOLN
25.0000 ug | INTRAMUSCULAR | Status: DC | PRN
  Administered 2017-04-01 (×4): 25 ug via INTRAVENOUS

## 2017-04-01 MED ORDER — OXYCODONE HCL 5 MG PO TABS
5.0000 mg | ORAL_TABLET | ORAL | Status: DC | PRN
  Filled 2017-04-01: qty 1

## 2017-04-01 MED ORDER — BUPIVACAINE HCL (PF) 0.5 % IJ SOLN
INTRAMUSCULAR | Status: AC
  Filled 2017-04-01: qty 30

## 2017-04-01 MED ORDER — ONDANSETRON HCL 4 MG/2ML IJ SOLN
4.0000 mg | Freq: Once | INTRAMUSCULAR | Status: AC | PRN
  Administered 2017-04-01: 4 mg via INTRAVENOUS

## 2017-04-01 MED ORDER — LABETALOL HCL 5 MG/ML IV SOLN
INTRAVENOUS | Status: AC
  Administered 2017-04-01: 10 mg via INTRAVENOUS
  Filled 2017-04-01: qty 4

## 2017-04-01 MED ORDER — OXYCODONE HCL 5 MG PO TABS
10.0000 mg | ORAL_TABLET | ORAL | Status: DC | PRN
  Filled 2017-04-01: qty 2

## 2017-04-01 MED ORDER — ONDANSETRON HCL 4 MG/2ML IJ SOLN
INTRAMUSCULAR | Status: AC
  Administered 2017-04-01: 4 mg via INTRAVENOUS
  Filled 2017-04-01: qty 2

## 2017-04-01 MED ORDER — LIDOCAINE HCL (CARDIAC) 20 MG/ML IV SOLN
INTRAVENOUS | Status: DC | PRN
  Administered 2017-04-01: 100 mg via INTRAVENOUS

## 2017-04-01 MED ORDER — PROPOFOL 10 MG/ML IV BOLUS
INTRAVENOUS | Status: DC | PRN
  Administered 2017-04-01: 150 mg via INTRAVENOUS

## 2017-04-01 MED ORDER — SUGAMMADEX SODIUM 200 MG/2ML IV SOLN
INTRAVENOUS | Status: DC | PRN
  Administered 2017-04-01: 200 mg via INTRAVENOUS

## 2017-04-01 MED ORDER — BUPIVACAINE HCL 0.5 % IJ SOLN
INTRAMUSCULAR | Status: DC | PRN
  Administered 2017-04-01: 10 mL

## 2017-04-01 MED ORDER — ROCURONIUM BROMIDE 50 MG/5ML IV SOLN
INTRAVENOUS | Status: AC
Start: 2017-04-01 — End: 2017-04-01
  Filled 2017-04-01: qty 1

## 2017-04-01 MED ORDER — DEXAMETHASONE SODIUM PHOSPHATE 10 MG/ML IJ SOLN
INTRAMUSCULAR | Status: DC | PRN
  Administered 2017-04-01: 10 mg via INTRAVENOUS

## 2017-04-01 MED ORDER — ROCURONIUM BROMIDE 100 MG/10ML IV SOLN
INTRAVENOUS | Status: DC | PRN
  Administered 2017-04-01: 40 mg via INTRAVENOUS
  Administered 2017-04-01: 10 mg via INTRAVENOUS

## 2017-04-01 MED ORDER — LACTATED RINGERS IV SOLN
INTRAVENOUS | Status: DC
  Administered 2017-04-01: 15:00:00 via INTRAVENOUS

## 2017-04-01 MED ORDER — LIDOCAINE HCL (PF) 2 % IJ SOLN
INTRAMUSCULAR | Status: AC
  Filled 2017-04-01: qty 10

## 2017-04-01 MED ORDER — ONDANSETRON HCL 4 MG/2ML IJ SOLN
INTRAMUSCULAR | Status: AC
Start: 2017-04-01 — End: 2017-04-01
  Filled 2017-04-01: qty 2

## 2017-04-01 MED ORDER — HYDRALAZINE HCL 20 MG/ML IJ SOLN
INTRAMUSCULAR | Status: AC
  Administered 2017-04-01: 10 mg via INTRAVENOUS
  Filled 2017-04-01: qty 1

## 2017-04-01 SURGICAL SUPPLY — 32 items
BLADE SURG SZ11 CARB STEEL (BLADE) ×3 IMPLANT
CHLORAPREP W/TINT 26ML (MISCELLANEOUS) ×3 IMPLANT
DERMABOND ADVANCED (GAUZE/BANDAGES/DRESSINGS) ×2
DERMABOND ADVANCED .7 DNX12 (GAUZE/BANDAGES/DRESSINGS) ×1 IMPLANT
DRAPE LAPAROTOMY 100X77 ABD (DRAPES) ×3 IMPLANT
DRSG TEGADERM 2-3/8X2-3/4 SM (GAUZE/BANDAGES/DRESSINGS) ×3 IMPLANT
DRSG TELFA 4X3 1S NADH ST (GAUZE/BANDAGES/DRESSINGS) ×3 IMPLANT
ELECT CAUTERY BLADE 6.4 (BLADE) ×3 IMPLANT
ELECT REM PT RETURN 9FT ADLT (ELECTROSURGICAL) ×3
ELECTRODE REM PT RTRN 9FT ADLT (ELECTROSURGICAL) ×1 IMPLANT
GLOVE PI ORTHOPRO 6.5 (GLOVE) ×2
GLOVE PI ORTHOPRO STRL 6.5 (GLOVE) ×1 IMPLANT
GLOVE SURG SYN 6.5 ES PF (GLOVE) ×3 IMPLANT
GOWN STRL REUS W/ TWL LRG LVL3 (GOWN DISPOSABLE) ×2 IMPLANT
GOWN STRL REUS W/TWL LRG LVL3 (GOWN DISPOSABLE) ×4
KIT RM TURNOVER CYSTO AR (KITS) ×3 IMPLANT
LABEL OR SOLS (LABEL) IMPLANT
LIGASURE VESSEL 5MM BLUNT TIP (ELECTROSURGICAL) IMPLANT
NEEDLE HYPO 22GX1.5 SAFETY (NEEDLE) ×3 IMPLANT
NS IRRIG 500ML POUR BTL (IV SOLUTION) ×3 IMPLANT
PACK BASIN MINOR ARMC (MISCELLANEOUS) ×3 IMPLANT
RETRACTOR WOUND ALXS 18CM SML (MISCELLANEOUS) ×1 IMPLANT
RTRCTR WOUND ALEXIS O 18CM SML (MISCELLANEOUS) ×3
SPONGE LAP 18X18 5 PK (GAUZE/BANDAGES/DRESSINGS) ×3 IMPLANT
SUT MNCRL AB 4-0 PS2 18 (SUTURE) ×3 IMPLANT
SUT VIC AB 2-0 CT1 27 (SUTURE) ×4
SUT VIC AB 2-0 CT1 TAPERPNT 27 (SUTURE) ×2 IMPLANT
SUT VIC AB 2-0 SH 27 (SUTURE) ×8
SUT VIC AB 2-0 SH 27XBRD (SUTURE) ×4 IMPLANT
SUT VIC AB 2-0 UR6 27 (SUTURE) IMPLANT
SYR 10ML LL (SYRINGE) ×3 IMPLANT
TRAY FOLEY W/METER SILVER 16FR (SET/KITS/TRAYS/PACK) ×3 IMPLANT

## 2017-04-01 NOTE — Anesthesia Preprocedure Evaluation (Signed)
Anesthesia Evaluation  Patient identified by MRN, date of birth, ID band Patient awake    Reviewed: Allergy & Precautions, NPO status , Patient's Chart, lab work & pertinent test results, reviewed documented beta blocker date and time   Airway Mallampati: III  TM Distance: >3 FB     Dental  (+) Chipped   Pulmonary           Cardiovascular hypertension, Pt. on medications      Neuro/Psych    GI/Hepatic   Endo/Other  diabetes, Type 2  Renal/GU      Musculoskeletal   Abdominal   Peds  Hematology   Anesthesia Other Findings Obese.  Reproductive/Obstetrics                             Anesthesia Physical Anesthesia Plan  ASA: III  Anesthesia Plan: General   Post-op Pain Management:    Induction: Intravenous  PONV Risk Score and Plan:   Airway Management Planned: Oral ETT  Additional Equipment:   Intra-op Plan:   Post-operative Plan:   Informed Consent: I have reviewed the patients History and Physical, chart, labs and discussed the procedure including the risks, benefits and alternatives for the proposed anesthesia with the patient or authorized representative who has indicated his/her understanding and acceptance.     Plan Discussed with: CRNA  Anesthesia Plan Comments:         Anesthesia Quick Evaluation

## 2017-04-01 NOTE — Op Note (Signed)
  Post Partum Tubal Ligation  Indication for procedure: desired permanent sterilization  Pre-op diagnosis: s/p term vaginal delivery, desired permanent sterilization Post-op diagnosis: same Procedure: post partum bilateral tubal ligation Surgeon: Hero Kulish Assist: none Anesthesia: general  IVF: 1000cc EBL: minimal UOP: none Findings: patent bilateral tubes, normal post-gravid uterine fundus Specimens: portion of left tube, portion of right tube  Complications: none apparent Disposition: stable to PACU  Procedure in detail: The patient was seen and confirmed desire for permanent sterilization.  She was identified as Vickie Hayes and was brought to the OR.  General anesthesia was administered, and the patient was prepped and draped in the usual sterile fashion.  A surgical time-out was called.  An 11-blade was used to incise the skin in the infraumbilical area.  The subcutaneous tissues were dissected to and then through the fascia, and the peritoneum was grasped and divided.  An alexis retractor was placed in the abdominal cavity and positioned.  The uterus was identified, and the right tube was grasped with a babcock and traced to the fimbriated end.  Clamps were placed and the underlying mesosalpinx was divided between.  2-0 Vicryl was used to tie the remaining tissue.  The same steps were used on the left side.  All dissected ends were found to be hemostatic.  The alexis retractor was removed, and the fascia was reapproximated with 0 vicryl.  The subcutaneous tissue was irrigated and then the skin was closed with 4-0 monocryl.  The skin was covered in surgical glue.  The sponge, needle, and instrument counts were correct x2.  The patient tolerated the procedure and was brought to PACU in a stable condition.  [name], MD Attending Obstetrician and Gynecologist [facility]

## 2017-04-01 NOTE — Anesthesia Procedure Notes (Signed)
Procedure Name: Intubation Date/Time: 04/01/2017 4:17 PM Performed by: Nelda Marseille, CRNA Pre-anesthesia Checklist: Patient identified, Patient being monitored, Timeout performed, Emergency Drugs available and Suction available Patient Re-evaluated:Patient Re-evaluated prior to induction Oxygen Delivery Method: Circle system utilized Preoxygenation: Pre-oxygenation with 100% oxygen Induction Type: IV induction Ventilation: Mask ventilation without difficulty Laryngoscope Size: Mac and 3 Grade View: Grade I Tube type: Oral Tube size: 7.0 mm Number of attempts: 1 Airway Equipment and Method: Stylet Placement Confirmation: ETT inserted through vocal cords under direct vision,  positive ETCO2 and breath sounds checked- equal and bilateral Secured at: 21 cm Tube secured with: Tape Dental Injury: Teeth and Oropharynx as per pre-operative assessment

## 2017-04-01 NOTE — Transfer of Care (Signed)
Immediate Anesthesia Transfer of Care Note  Patient: Irmgard N Taher  Procedure(s) Performed: POST PARTUM TUBAL LIGATION (Bilateral )  Patient Location: PACU  Anesthesia Type:General  Level of Consciousness: awake, alert  and oriented  Airway & Oxygen Therapy: Patient Spontanous Breathing and Patient connected to face mask oxygen  Post-op Assessment: Report given to RN and Post -op Vital signs reviewed and stable  Post vital signs: Reviewed and stable  Last Vitals:  Vitals:   04/01/17 1447 04/01/17 1737  BP: (!) 143/89 (!) 168/113  Pulse: (!) 106 (!) 105  Resp: 18 17  Temp: 37.3 C 37.2 C  SpO2: 99% 100%    Last Pain:  Vitals:   04/01/17 1737  TempSrc: Temporal  PainSc:          Complications: No apparent anesthesia complications

## 2017-04-01 NOTE — OR Nursing (Signed)
Documented blood sugar of 53. Patient is not diabetic. She reports that was the baby's blood sugar.

## 2017-04-01 NOTE — Anesthesia Postprocedure Evaluation (Signed)
Anesthesia Post Note  Patient: Vickie Hayes  Procedure(s) Performed: POST PARTUM TUBAL LIGATION (Bilateral )  Patient location during evaluation: PACU Anesthesia Type: General Level of consciousness: awake and alert and oriented Pain management: pain level controlled Vital Signs Assessment: post-procedure vital signs reviewed and stable Respiratory status: spontaneous breathing Cardiovascular status: blood pressure returned to baseline Anesthetic complications: no     Last Vitals:  Vitals:   04/01/17 1850 04/01/17 1916  BP: (!) 156/99 (!) 153/87  Pulse: (!) 105 (!) 113  Resp: 20 20  Temp: 37.1 C 36.8 C  SpO2: 96% 97%    Last Pain:  Vitals:   04/01/17 1916  TempSrc: Oral  PainSc:                  Branson Kranz

## 2017-04-01 NOTE — Progress Notes (Signed)
Patient left women's care unit to peri-op for surgery. Family at bedside with baby.

## 2017-04-01 NOTE — Anesthesia Post-op Follow-up Note (Signed)
Anesthesia QCDR form completed.        

## 2017-04-01 NOTE — Progress Notes (Signed)
Post Partum Day 1 Subjective: Doing well, no complaints.  Tolerating regular diet, pain with PO meds, voiding and ambulating without difficulty.  No CP SOB Fever,Chills, N/V or leg pain; denies nipple or breast pain no HA change of vision, RUQ/epigastric pain  Objective: LMP 08/13/2016 (Approximate)    Physical Exam:  General: NAD Breasts: soft/nontender CV: RRR Pulm: nl effort, CTABL Abdomen: soft, NT, BS x 4 Perineum: minimal edema, intact/lacerations hemostatic/repair well approximated Lochia: moderate Uterine Fundus: fundus firm and 2 fb below umbilicus DVT Evaluation: no cords, ttp LEs   Recent Labs    03/31/17 1909 04/01/17 0426  HGB 12.7 11.2*  HCT 38.2 33.9*  WBC 18.2* 19.3*  PLT 119* 110*    Assessment/Plan: 31 y.o. [id number] postpartum day # 1  - Continue routine PP care - encouraged snug fitting bra and cabbage leaves for bottlefeeding.  - BTL planned today at 1430.  - Immunization status: all Imms up to date    Disposition: remain Inpt PP care    McVey, REBECCA A, CNM 04/01/17 1:05 PM

## 2017-04-01 NOTE — Progress Notes (Signed)
Blood sugar result: 53 is from Vickie Hayes, NOT mom.

## 2017-04-02 ENCOUNTER — Encounter: Payer: Self-pay | Admitting: Obstetrics & Gynecology

## 2017-04-02 MED ORDER — SIMETHICONE 80 MG PO CHEW: 80.0000 mg | CHEWABLE_TABLET | ORAL | 0 refills | Status: DC | PRN

## 2017-04-02 MED ORDER — SENNOSIDES-DOCUSATE SODIUM 8.6-50 MG PO TABS: 2.0000 | ORAL_TABLET | Freq: Every evening | ORAL | 0 refills | Status: DC | PRN

## 2017-04-02 MED ORDER — DIBUCAINE 1 % RE OINT: 1.0000 "application " | TOPICAL_OINTMENT | RECTAL | 0 refills | Status: DC | PRN

## 2017-04-02 MED ORDER — ACETAMINOPHEN 325 MG PO TABS: 650.0000 mg | ORAL_TABLET | Freq: Four times a day (QID) | ORAL | 0 refills | Status: DC | PRN

## 2017-04-02 MED ORDER — WITCH HAZEL-GLYCERIN EX PADS: 1.0000 "application " | MEDICATED_PAD | CUTANEOUS | 12 refills | Status: DC | PRN

## 2017-04-02 MED ORDER — IBUPROFEN 600 MG PO TABS: 600.0000 mg | ORAL_TABLET | Freq: Four times a day (QID) | ORAL | 0 refills | Status: DC

## 2017-04-02 NOTE — Progress Notes (Signed)
Post Partum Day 2 Subjective: Doing well, no complaints.  Tolerating regular diet, pain with PO meds, voiding and ambulating without difficulty.  No CP SOB Fever,Chills, N/V or leg pain; denies nipple or breast pain no HA change of vision, RUQ/epigastric pain  Objective: BP 131/70 (BP Location: Right Arm)   Pulse (!) 104   Temp 98.3 F (36.8 C) (Oral)   Resp 20   Ht 5\' 3"  (1.6 m)   Wt 198 lb (89.8 kg)   LMP 08/13/2016 (Approximate)   SpO2 98%   Breastfeeding? Unknown   BMI 35.07 kg/m    Physical Exam:  General: NAD Breasts: soft/nontender CV: RRR Pulm: nl effort, CTABL Abdomen: soft, NT, BS x 4 Periumbilical Incision:  CDI with medical glue, no erythema or drainage Perineum: minimal edema, intact Lochia: moderate Uterine Fundus: fundus firm and 1 fb below umbilicus DVT Evaluation: no cords, ttp LEs   Recent Labs    03/31/17 1909 04/01/17 0426  HGB 12.7 11.2*  HCT 38.2 33.9*  WBC 18.2* 19.3*  PLT 119* 110*    Assessment/Plan: 31 y.o. [id number] postpartum day # 2  - Continue routine PP care - encouraged snug fitting bra and cabbage leaves for bottlefeeding.  - BTL done yesterday.  - Immunization status: / all Imms up to date    Disposition: Does desire Dc home today.     Leshawn Houseworth A, CNM 04/02/17  7:40 AM

## 2017-04-02 NOTE — Progress Notes (Signed)
Patient discharged home with infant. Discharge instructions and prescriptions given and reviewed with patient. Patient verbalized understanding. Escorted out by staff.  

## 2017-04-05 LAB — SURGICAL PATHOLOGY

## 2018-06-11 ENCOUNTER — Other Ambulatory Visit: Payer: Self-pay

## 2018-06-11 ENCOUNTER — Emergency Department: Payer: Self-pay

## 2018-06-11 ENCOUNTER — Emergency Department
Admission: EM | Admit: 2018-06-11 | Discharge: 2018-06-11 | Disposition: A | Discharge status: Home / Self Care | Payer: Self-pay | Attending: Emergency Medicine | Admitting: Emergency Medicine

## 2018-06-11 DIAGNOSIS — R05 Cough: Secondary | ICD-10-CM | POA: Insufficient documentation

## 2018-06-11 DIAGNOSIS — I10 Essential (primary) hypertension: Secondary | ICD-10-CM | POA: Insufficient documentation

## 2018-06-11 DIAGNOSIS — R059 Cough, unspecified: Secondary | ICD-10-CM

## 2018-06-11 MED ORDER — AZITHROMYCIN 250 MG PO TABS: ORAL_TABLET | ORAL | 0 refills | Status: DC

## 2018-06-11 MED ORDER — HYDROCOD POLST-CPM POLST ER 10-8 MG/5ML PO SUER: 5.0000 mL | Freq: Two times a day (BID) | ORAL | 0 refills | Status: DC

## 2018-06-11 NOTE — ED Triage Notes (Signed)
Reports cough and feeling short of breath.

## 2018-06-11 NOTE — ED Provider Notes (Signed)
[facility] Emergency Department Provider Note       Time seen: ----------------------------------------- 7:20 AM on 06/11/2018 -----------------------------------------   I have reviewed the triage vital signs and the nursing notes.  HISTORY   Chief Complaint Cough and Shortness of Breath   HPI Vickie Hayes is a 33 y.o. female with a history of hypertension, who presents to the ED for cough and feelings of shortness of breath.  Patient states she is seen her doctor twice for this, initially which she was treated with antihistamines and subsequently treated with Jerilynn Som.  She reports she is still coughing and short of breath.  She denies any flulike symptoms.  Past Medical History:  Diagnosis Date  . Bacterial vaginosis   . Gestational diabetes    pt denies  . History of Papanicolaou smear of cervix 03/23/2013;10102017   NEG; NEG  . Hypertension   . Medical history non-contributory   . Oligomenorrhea    OFF BC    Patient Active Problem List   Diagnosis Date Noted  . Gestational hypertension w/o significant proteinuria in 3rd trimester 03/31/2017  . Chronic hypertension affecting pregnancy 03/18/2017  . Supervision of high risk pregnancy in third trimester 09/16/2016    Past Surgical History:  Procedure Laterality Date  . NEXPLANON INSERTION  04/23/2013  . NEXPLANON REMOVAL  10/2013   CHARLES DREW  . TUBAL LIGATION Bilateral 04/01/2017   Procedure: POST PARTUM TUBAL LIGATION;  Surgeon: Ward, Elenora Fender, MD;  Location: [facility] ORS;  Service: Gynecology;  Laterality: Bilateral;  . WISDOM TOOTH EXTRACTION Bilateral     Allergies Patient has no known allergies.  Social History Social History   Tobacco Use  . Smoking status: Never Smoker  . Smokeless tobacco: Never Used  Substance Use Topics  . Alcohol use: No  . Drug use: No   Review of Systems Constitutional: Negative for fever. Cardiovascular: Negative for chest  pain. Respiratory: Positive for cough and shortness of breath Gastrointestinal: Negative for abdominal pain, vomiting and diarrhea. Musculoskeletal: Negative for back pain. Skin: Negative for rash. Neurological: Negative for headaches, focal weakness or numbness.  All systems negative/normal/unremarkable except as stated in the HPI  ____________________________________________   PHYSICAL EXAM:  VITAL SIGNS: ED Triage Vitals  Enc Vitals Group     BP 06/11/18 0657 (!) 155/83     Pulse Rate 06/11/18 0657 84     Resp 06/11/18 0657 18     Temp 06/11/18 0657 98.1 F (36.7 C)     Temp Source 06/11/18 0657 Oral     SpO2 06/11/18 0657 100 %     Weight --      Height 06/11/18 0655 5\' 3"  (1.6 m)     Head Circumference --      Peak Flow --      Pain Score 06/11/18 0655 0     Pain Loc --      Pain Edu? --      Excl. in GC? --    Constitutional: Alert and oriented. Well appearing and in no distress. Eyes: Conjunctivae are normal. Normal extraocular movements. ENT      Head: Normocephalic and atraumatic.      Nose: No congestion/rhinnorhea.      Mouth/Throat: Mucous membranes are moist.      Neck: No stridor. Cardiovascular: Normal rate, regular rhythm. No murmurs, rubs, or gallops. Respiratory: Normal respiratory effort without tachypnea nor retractions. Breath sounds are clear and equal bilaterally. No wheezes/rales/rhonchi. Gastrointestinal: Soft and nontender. Normal bowel  sounds Musculoskeletal: Nontender with normal range of motion in extremities. No lower extremity tenderness nor edema. Neurologic:  Normal speech and language. No gross focal neurologic deficits are appreciated.  Skin:  Skin is warm, dry and intact. No rash noted. Psychiatric: Mood and affect are normal. Speech and behavior are normal.  ___________________________________________  ED COURSE:  As part of my medical decision making, I reviewed the following data within the electronic MEDICAL RECORD NUMBER History  obtained from family if available, nursing notes, old chart and ekg, as well as notes from prior ED visits. Patient presented for cough and shortness of breath, we will assess with labs and imaging as indicated at this time.   Procedures Vickie Hayes was evaluated in Emergency Department on 06/11/2018 for the symptoms described in the history of present illness. She was evaluated in the context of the global COVID-19 pandemic, which necessitated consideration that the patient might be at risk for infection with the SARS-CoV-2 virus that causes COVID-19. Institutional protocols and algorithms that pertain to the evaluation of patients at risk for COVID-19 are in a state of rapid change based on information released by regulatory bodies including the CDC and federal and state organizations. These policies and algorithms were followed during the patient's care in the ED.   RADIOLOGY Images were viewed by me  Chest x-ray is normal  ____________________________________________   DIFFERENTIAL DIAGNOSIS   Bronchitis, URI, coronavirus  FINAL ASSESSMENT AND PLAN  Cough   Plan: The patient had presented for persistent cough. Patient's imaging is unremarkable.  Given that she is been coughing for 2 weeks, I will prescribe a Z-Pak Intestinex.  I will advise quarantine at home for the next week or 72 hours from last symptoms.   Ulice Dash, MD    Note: This note was generated in part or whole with voice recognition software. Voice recognition is usually quite accurate but there are transcription errors that can and very often do occur. I apologize for any typographical errors that were not detected and corrected.     Emily Filbert, MD 06/11/18 0730

## 2018-06-11 NOTE — ED Notes (Signed)
X-ray at bedside

## 2019-01-06 ENCOUNTER — Ambulatory Visit: Admit: 2019-01-06 | Payer: Self-pay | Admitting: Obstetrics & Gynecology

## 2019-01-06 SURGERY — DILATATION & CURETTAGE/HYSTEROSCOPY WITH MYOSURE
Anesthesia: Choice

## 2019-04-28 ENCOUNTER — Emergency Department
Admission: EM | Admit: 2019-04-28 | Discharge: 2019-04-28 | Disposition: A | Discharge status: Home / Self Care | Payer: Self-pay | Attending: Emergency Medicine | Admitting: Emergency Medicine

## 2019-04-28 ENCOUNTER — Emergency Department: Payer: Self-pay

## 2019-04-28 ENCOUNTER — Encounter: Payer: Self-pay | Admitting: Emergency Medicine

## 2019-04-28 ENCOUNTER — Other Ambulatory Visit: Payer: Self-pay

## 2019-04-28 DIAGNOSIS — Z79899 Other long term (current) drug therapy: Secondary | ICD-10-CM | POA: Insufficient documentation

## 2019-04-28 DIAGNOSIS — N23 Unspecified renal colic: Secondary | ICD-10-CM | POA: Insufficient documentation

## 2019-04-28 DIAGNOSIS — I10 Essential (primary) hypertension: Secondary | ICD-10-CM | POA: Insufficient documentation

## 2019-04-28 LAB — COMPREHENSIVE METABOLIC PANEL
ALT: 16 U/L (ref 0–44)
AST: 18 U/L (ref 15–41)
Albumin: 4.3 g/dL (ref 3.5–5.0)
Alkaline Phosphatase: 41 U/L (ref 38–126)
Anion gap: 13 (ref 5–15)
BUN: 14 mg/dL (ref 6–20)
CO2: 23 mmol/L (ref 22–32)
Calcium: 8.8 mg/dL — ABNORMAL LOW (ref 8.9–10.3)
Chloride: 95 mmol/L — ABNORMAL LOW (ref 98–111)
Creatinine, Ser: 0.71 mg/dL (ref 0.44–1.00)
GFR calc Af Amer: >60 mL/min (ref >60)
GFR calc non Af Amer: >60 mL/min (ref >60)
Glucose, Bld: 112 mg/dL — ABNORMAL HIGH (ref 70–99)
Potassium: 3.4 mmol/L — ABNORMAL LOW (ref 3.5–5.1)
Sodium: 131 mmol/L — ABNORMAL LOW (ref 135–145)
Total Bilirubin: 0.5 mg/dL (ref 0.3–1.2)
Total Protein: 7.2 g/dL (ref 6.5–8.1)

## 2019-04-28 LAB — URINALYSIS, COMPLETE (UACMP) WITH MICROSCOPIC
Bilirubin Urine: NEGATIVE
Glucose, UA: NEGATIVE mg/dL
Ketones, ur: NEGATIVE mg/dL
Leukocytes,Ua: NEGATIVE
Nitrite: NEGATIVE
Protein, ur: 30 mg/dL — AB
RBC / HPF: >50 RBC/hpf — ABNORMAL HIGH (ref 0–5)
Specific Gravity, Urine: 1.023 (ref 1.005–1.030)
pH: 5 (ref 5.0–8.0)

## 2019-04-28 LAB — CBC
HCT: 34.6 % — ABNORMAL LOW (ref 36.0–46.0)
Hemoglobin: 11.5 g/dL — ABNORMAL LOW (ref 12.0–15.0)
MCH: 29.3 pg (ref 26.0–34.0)
MCHC: 33.2 g/dL (ref 30.0–36.0)
MCV: 88 fL (ref 80.0–100.0)
Platelets: 162 10*3/uL (ref 150–400)
RBC: 3.93 MIL/uL (ref 3.87–5.11)
RDW: 14.1 % (ref 11.5–15.5)
WBC: 8.6 10*3/uL (ref 4.0–10.5)
nRBC: 0 % (ref 0.0–0.2)

## 2019-04-28 LAB — POCT PREGNANCY, URINE: Preg Test, Ur: NEGATIVE

## 2019-04-28 LAB — LIPASE, BLOOD: Lipase: 30 U/L (ref 11–51)

## 2019-04-28 MED ORDER — OXYCODONE-ACETAMINOPHEN 5-325 MG PO TABS
1.0000 | ORAL_TABLET | Freq: Once | ORAL | Status: AC
  Administered 2019-04-28: 1 via ORAL
  Filled 2019-04-28: qty 1

## 2019-04-28 MED ORDER — FENTANYL CITRATE (PF) 100 MCG/2ML IJ SOLN
50.0000 ug | Freq: Once | INTRAMUSCULAR | Status: AC
  Administered 2019-04-28: 50 ug via INTRAVENOUS
  Filled 2019-04-28: qty 2

## 2019-04-28 MED ORDER — IBUPROFEN 800 MG PO TABS: 800.0000 mg | ORAL_TABLET | Freq: Three times a day (TID) | ORAL | 0 refills | Status: DC | PRN

## 2019-04-28 MED ORDER — KETOROLAC TROMETHAMINE 30 MG/ML IJ SOLN
10.0000 mg | Freq: Once | INTRAMUSCULAR | Status: AC
  Administered 2019-04-28: 9.9 mg via INTRAVENOUS
  Filled 2019-04-28: qty 1

## 2019-04-28 MED ORDER — ONDANSETRON 4 MG PO TBDP: 4.0000 mg | ORAL_TABLET | Freq: Three times a day (TID) | ORAL | 0 refills | Status: DC | PRN

## 2019-04-28 MED ORDER — OXYCODONE-ACETAMINOPHEN 5-325 MG PO TABS: 1.0000 | ORAL_TABLET | ORAL | 0 refills | Status: DC | PRN

## 2019-04-28 MED ORDER — ONDANSETRON HCL 4 MG/2ML IJ SOLN
4.0000 mg | Freq: Once | INTRAMUSCULAR | Status: AC | PRN
  Administered 2019-04-28: 4 mg via INTRAVENOUS
  Filled 2019-04-28: qty 2

## 2019-04-28 MED ORDER — TAMSULOSIN HCL 0.4 MG PO CAPS: 0.4000 mg | ORAL_CAPSULE | Freq: Every day | ORAL | 0 refills | Status: DC

## 2019-04-28 MED ORDER — SODIUM CHLORIDE 0.9 % IV BOLUS
1000.0000 mL | Freq: Once | INTRAVENOUS | Status: AC
  Administered 2019-04-28: 02:00:00 1000 mL via INTRAVENOUS

## 2019-04-28 MED ORDER — TAMSULOSIN HCL 0.4 MG PO CAPS
0.4000 mg | ORAL_CAPSULE | Freq: Once | ORAL | Status: AC
  Administered 2019-04-28: 0.4 mg via ORAL
  Filled 2019-04-28: qty 1

## 2019-04-28 NOTE — ED Triage Notes (Signed)
Pt arrives POV to triage with c/o N/V and RLQ abdominal pain. Pt states that the pain and vomiting came on suddenly. Pt is actively vomiting at this time in triage.

## 2019-04-28 NOTE — ED Provider Notes (Signed)
[facility] Emergency Department Provider Note   ____________________________________________   First MD Initiated Contact with Patient 04/28/19 0240     (approximate)  I have reviewed the triage vital signs and the nursing notes.   HISTORY  Chief Complaint Abdominal Pain    HPI Vickie Hayes is a 34 y.o. female who presents to the ED from home with a chief complaint of flank and abdominal pain.  Patient reports sudden onset of right flank/lower quadrant pain approximately 8 PM.  She had a bowel movement which improved the pain and she went to bed only to be awakened by return of the pain now associated with nausea and vomiting.  Denies fever, cough, chest pain, shortness of breath, dysuria, diarrhea.  Denies recent travel or trauma.       Past Medical History:  Diagnosis Date  . Bacterial vaginosis   . Gestational diabetes    pt denies  . History of Papanicolaou smear of cervix 03/23/2013;[id number]   NEG; NEG  . Hypertension   . Medical history non-contributory   . Oligomenorrhea    OFF BC    Patient Active Problem List   Diagnosis Date Noted  . Gestational hypertension w/o significant proteinuria in 3rd trimester 03/31/2017  . Chronic hypertension affecting pregnancy 03/18/2017  . Supervision of high risk pregnancy in third trimester 09/16/2016    Past Surgical History:  Procedure Laterality Date  . NEXPLANON INSERTION  04/23/2013  . NEXPLANON REMOVAL  10/2013   CHARLES DREW  . TUBAL LIGATION Bilateral 04/01/2017   Procedure: POST PARTUM TUBAL LIGATION;  Surgeon: Ward, Elenora Fender, MD;  Location: [facility] ORS;  Service: Gynecology;  Laterality: Bilateral;  . WISDOM TOOTH EXTRACTION Bilateral     Prior to Admission medications   Medication Sig Start Date End Date Taking? Authorizing Provider  acetaminophen (TYLENOL) 325 MG tablet Take 2 tablets (650 mg total) by mouth every 6 (six) hours as needed (for pain scale < 4). 04/02/17   McVey,  Prudencio Pair, CNM  azithromycin (ZITHROMAX Z-PAK) 250 MG tablet Take 2 tablets (500 mg) on  Day 1,  followed by 1 tablet (250 mg) once daily on Days 2 through 5. 06/11/18   Emily Filbert, MD  chlorpheniramine-HYDROcodone (TUSSIONEX PENNKINETIC ER) 10-8 MG/5ML SUER Take 5 mLs by mouth 2 (two) times daily. 06/11/18   Emily Filbert, MD  dibucaine (NUPERCAINAL) 1 % OINT Place 1 application rectally as needed for hemorrhoids. 04/02/17   McVey, Prudencio Pair, CNM  ibuprofen (ADVIL) 800 MG tablet Take 1 tablet (800 mg total) by mouth every 8 (eight) hours as needed for moderate pain. 04/28/19   Irean Hong, MD  ondansetron (ZOFRAN ODT) 4 MG disintegrating tablet Take 1 tablet (4 mg total) by mouth every 8 (eight) hours as needed for nausea or vomiting. 04/28/19   Irean Hong, MD  oxyCODONE-acetaminophen (PERCOCET/ROXICET) 5-325 MG tablet Take 1 tablet by mouth every 4 (four) hours as needed for severe pain. 04/28/19   Irean Hong, MD  Prenatal Vit-Fe Fumarate-FA (PRENATAL MULTIVITAMIN) TABS tablet Take 1 tablet by mouth daily at 12 noon.    [provider]  senna-docusate (SENOKOT-S) 8.6-50 MG tablet Take 2 tablets by mouth at bedtime as needed for mild constipation. 04/02/17   McVey, Prudencio Pair, CNM  simethicone (MYLICON) 80 MG chewable tablet Chew 1 tablet (80 mg total) by mouth as needed for flatulence. 04/02/17   McVey, Prudencio Pair, CNM  tamsulosin (FLOMAX) 0.4 MG CAPS capsule Take  1 capsule (0.4 mg total) by mouth daily. 04/28/19   Irean Hong, MD  witch hazel-glycerin (TUCKS) pad Apply 1 application topically as needed for hemorrhoids. 04/02/17   McVey, Prudencio Pair, CNM    Allergies Patient has no known allergies.  Family History  Problem Relation Age of Onset  . HIV Mother   . Tuberculosis Mother   . Hypertension Mother   . Cancer Maternal Grandmother   . Hypertension Maternal Grandmother   . Diabetes Maternal Grandmother     Social History Social History   Tobacco Use  . Smoking  status: Never Smoker  . Smokeless tobacco: Never Used  Substance Use Topics  . Alcohol use: No  . Drug use: No    Review of Systems  Constitutional: No fever/chills Eyes: No visual changes. ENT: No sore throat. Cardiovascular: Denies chest pain. Respiratory: Denies shortness of breath. Gastrointestinal: Positive for right flank and right lower abdominal pain.  Positive for nausea and vomiting.  No diarrhea.  No constipation. Genitourinary: Negative for dysuria. Musculoskeletal: Negative for back pain. Skin: Negative for rash. Neurological: Negative for headaches, focal weakness or numbness.   ____________________________________________   PHYSICAL EXAM:  VITAL SIGNS: ED Triage Vitals [04/28/19 0218]  Enc Vitals Group     BP (!) 158/98     Pulse Rate 73     Resp 18     Temp 98.2 F (36.8 C)     Temp Source Oral     SpO2 100 %     Weight 160 lb (72.6 kg)     Height 5\' 3"  (1.6 m)     Head Circumference      Peak Flow      Pain Score 10     Pain Loc      Pain Edu?      Excl. in GC?     Constitutional: Alert and oriented. Well appearing and in mild acute distress. Eyes: Conjunctivae are normal. PERRL. EOMI. Head: Atraumatic. Nose: No congestion/rhinnorhea. Mouth/Throat: Mucous membranes are moist.  Oropharynx non-erythematous. Neck: No stridor.   Cardiovascular: Normal rate, regular rhythm. Grossly normal heart sounds.  Good peripheral circulation. Respiratory: Normal respiratory effort.  No retractions. Lungs CTAB. Gastrointestinal: Soft and mildly tender to palpation right lower quadrant without rebound or guarding. No distention. No abdominal bruits.  Mild right CVA tenderness. Musculoskeletal: No lower extremity tenderness nor edema.  No joint effusions. Neurologic:  Normal speech and language. No gross focal neurologic deficits are appreciated. No gait instability. Skin:  Skin is warm, dry and intact. No rash noted. Psychiatric: Mood and affect are normal.  Speech and behavior are normal.  ____________________________________________   LABS (all labs ordered are listed, but only abnormal results are displayed)  Labs Reviewed  COMPREHENSIVE METABOLIC PANEL - Abnormal; Notable for the following components:      Result Value   Sodium 131 (*)    Potassium 3.4 (*)    Chloride 95 (*)    Glucose, Bld 112 (*)    Calcium 8.8 (*)    All other components within normal limits  CBC - Abnormal; Notable for the following components:   Hemoglobin 11.5 (*)    HCT 34.6 (*)    All other components within normal limits  URINALYSIS, COMPLETE (UACMP) WITH MICROSCOPIC - Abnormal; Notable for the following components:   Color, Urine YELLOW (*)    APPearance HAZY (*)    Hgb urine dipstick LARGE (*)    Protein, ur 30 (*)    RBC /  HPF >50 (*)    Bacteria, UA RARE (*)    All other components within normal limits  LIPASE, BLOOD  POCT PREGNANCY, URINE  POC URINE PREG, ED   ____________________________________________  EKG  None ____________________________________________  RADIOLOGY  ED MD interpretation: 5 mm distal right ureteral stone with hydroureteronephrosis  Official radiology report(s): CT Renal Stone Study  Result Date: 04/28/2019 CLINICAL DATA:  Sudden onset of right-sided abdominal pain. EXAM: CT ABDOMEN AND PELVIS WITHOUT CONTRAST TECHNIQUE: Multidetector CT imaging of the abdomen and pelvis was performed following the standard protocol without IV contrast. COMPARISON:  None. FINDINGS: Lower chest: Normal Hepatobiliary: Normal Pancreas: Normal Spleen: Normal Adrenals/Urinary Tract: Adrenal glands are normal. Left kidney contains several nonobstructing calculi, the 2 largest measuring 5 mm. The right kidney shows acute obstruction with swelling and hydronephrosis. There is a nonobstructing 4 mm stone in the midportion of the right kidney. The ureter is dilated all the way to the mid pelvis, just proximal to the UVJ, where there is a 5 mm  stone. No stone in the bladder. Stomach/Bowel: No abnormal bowel finding.  Normal appendix. Vascular/Lymphatic: Normal Reproductive: Normal Other: No free fluid or air. Musculoskeletal: Normal IMPRESSION: None acute hydroureteronephrosis on the right due to a 5 mm stone in the distal right ureter 1 cm proximal to the UVJ. Few other nonobstructing renal calculi within both kidneys. Electronically Signed   By: Paulina Fusi M.D.   On: 04/28/2019 03:13    ____________________________________________   PROCEDURES  Procedure(s) performed (including Critical Care):  Procedures   ____________________________________________   INITIAL IMPRESSION / ASSESSMENT AND PLAN / ED COURSE  As part of my medical decision making, I reviewed the following data within the electronic MEDICAL RECORD NUMBER Nursing notes reviewed and incorporated, Labs reviewed, Old chart reviewed, Radiograph reviewed, Notes from prior ED visits and [place] Controlled Substance Database     Jamella N Szafran was evaluated in Emergency Department on 04/28/2019 for the symptoms described in the history of present illness. She was evaluated in the context of the global COVID-19 pandemic, which necessitated consideration that the patient might be at risk for infection with the SARS-CoV-2 virus that causes COVID-19. Institutional protocols and algorithms that pertain to the evaluation of patients at risk for COVID-19 are in a state of rapid change based on information released by regulatory bodies including the CDC and federal and state organizations. These policies and algorithms were followed during the patient's care in the ED.    34 year old female who presents with sudden onset right flank/abdominal pain, nausea and vomiting. Differential diagnosis includes, but is not limited to, ovarian cyst, ovarian torsion, acute appendicitis, diverticulitis, urinary tract infection/pyelonephritis, endometriosis, bowel obstruction, colitis, renal colic,  gastroenteritis, hernia, fibroids, endometriosis, pregnancy related pain including ectopic pregnancy, etc.  We will obtain basic lab work, initiate IV fluid resuscitation, IV fentanyl for pain paired with IV Zofran for nausea.  Obtain CT renal stone study.   Clinical Course as of Apr 27 457  Sat Apr 28, 2019  1031 Patient feeling significantly better. Strict return precautions given. Patient verbalizes understanding and agrees with plan of care.   [JS]    Clinical Course User Index [JS] Irean Hong, MD     ____________________________________________   FINAL CLINICAL IMPRESSION(S) / ED DIAGNOSES  Final diagnoses:  Renal colic on right side     ED Discharge Orders         Ordered    tamsulosin (FLOMAX) 0.4 MG CAPS capsule  Daily  04/28/19 0322    ibuprofen (ADVIL) 800 MG tablet  Every 8 hours PRN     04/28/19 0322    oxyCODONE-acetaminophen (PERCOCET/ROXICET) 5-325 MG tablet  Every 4 hours PRN     04/28/19 0322    ondansetron (ZOFRAN ODT) 4 MG disintegrating tablet  Every 8 hours PRN     04/28/19 0322           Note:  This document was prepared using Dragon voice recognition software and may include unintentional dictation errors.   Paulette Blanch, MD 04/28/19 [phone]

## 2019-04-28 NOTE — ED Notes (Signed)
Peripheral IV discontinued. Catheter intact. No signs of infiltration or redness. Gauze applied to IV site.   Discharge instructions reviewed with patient. Questions fielded by this RN. Patient verbalizes understanding of instructions. Patient discharged home in stable condition per sung. No acute distress noted at time of discharge.   Pt ambulatory to lobby; mother waiting in drive thru

## 2019-04-28 NOTE — ED Notes (Signed)
Pt given phone with mother called for her

## 2019-04-28 NOTE — Discharge Instructions (Signed)
1. Take pain & nausea medicines as needed (Percocet/Zofran #30). Make sure to take a stool softener while taking narcotic pain medicines. 2. Take Flomax 0.4mg daily x 14 days. 3. Drink plenty of bottled or filtered water daily. 4. Return to the ER for worsening symptoms, persistent vomiting, fever, difficulty breathing or other concerns.  

## 2019-04-28 NOTE — ED Notes (Signed)
Pt informed that mother was here and that she should go ahead and put her clothes on while she is waiting for DC paperwork.

## 2019-08-11 ENCOUNTER — Emergency Department: Payer: Self-pay

## 2019-08-11 ENCOUNTER — Emergency Department (HOSPITAL_COMMUNITY)
Admission: EM | Admit: 2019-08-11 | Discharge: 2019-08-11 | Disposition: A | Discharge status: Home / Self Care | Payer: Self-pay | Attending: Emergency Medicine | Admitting: Emergency Medicine

## 2019-08-11 ENCOUNTER — Encounter: Payer: Self-pay | Admitting: Emergency Medicine

## 2019-08-11 ENCOUNTER — Emergency Department
Admission: EM | Admit: 2019-08-11 | Discharge: 2019-08-11 | Disposition: A | Discharge status: Home / Self Care | Payer: Self-pay | Attending: Emergency Medicine | Admitting: Emergency Medicine

## 2019-08-11 ENCOUNTER — Other Ambulatory Visit: Payer: Self-pay

## 2019-08-11 DIAGNOSIS — R102 Pelvic and perineal pain: Secondary | ICD-10-CM | POA: Insufficient documentation

## 2019-08-11 DIAGNOSIS — Z5321 Procedure and treatment not carried out due to patient leaving prior to being seen by health care provider: Secondary | ICD-10-CM | POA: Insufficient documentation

## 2019-08-11 DIAGNOSIS — R109 Unspecified abdominal pain: Secondary | ICD-10-CM | POA: Insufficient documentation

## 2019-08-11 DIAGNOSIS — I10 Essential (primary) hypertension: Secondary | ICD-10-CM | POA: Insufficient documentation

## 2019-08-11 DIAGNOSIS — N200 Calculus of kidney: Secondary | ICD-10-CM | POA: Insufficient documentation

## 2019-08-11 LAB — COMPREHENSIVE METABOLIC PANEL
ALT: 15 U/L (ref 0–44)
AST: 17 U/L (ref 15–41)
Albumin: 3.9 g/dL (ref 3.5–5.0)
Alkaline Phosphatase: 43 U/L (ref 38–126)
Anion gap: 10 (ref 5–15)
BUN: 11 mg/dL (ref 6–20)
CO2: 24 mmol/L (ref 22–32)
Calcium: 9.5 mg/dL (ref 8.9–10.3)
Chloride: 105 mmol/L (ref 98–111)
Creatinine, Ser: 0.74 mg/dL (ref 0.44–1.00)
GFR calc Af Amer: >60 mL/min (ref >60)
GFR calc non Af Amer: >60 mL/min (ref >60)
Glucose, Bld: 126 mg/dL — ABNORMAL HIGH (ref 70–99)
Potassium: 3.9 mmol/L (ref 3.5–5.1)
Sodium: 139 mmol/L (ref 135–145)
Total Bilirubin: 0.7 mg/dL (ref 0.3–1.2)
Total Protein: 6.8 g/dL (ref 6.5–8.1)

## 2019-08-11 LAB — URINALYSIS, COMPLETE (UACMP) WITH MICROSCOPIC
Bilirubin Urine: NEGATIVE
Glucose, UA: NEGATIVE mg/dL
Ketones, ur: NEGATIVE mg/dL
Leukocytes,Ua: NEGATIVE
Nitrite: NEGATIVE
Protein, ur: NEGATIVE mg/dL
Specific Gravity, Urine: 1.023 (ref 1.005–1.030)
pH: 5 (ref 5.0–8.0)

## 2019-08-11 LAB — CBC
HCT: 31.6 % — ABNORMAL LOW (ref 36.0–46.0)
Hemoglobin: 10 g/dL — ABNORMAL LOW (ref 12.0–15.0)
MCH: 28.2 pg (ref 26.0–34.0)
MCHC: 31.6 g/dL (ref 30.0–36.0)
MCV: 89 fL (ref 80.0–100.0)
Platelets: 197 10*3/uL (ref 150–400)
RBC: 3.55 MIL/uL — ABNORMAL LOW (ref 3.87–5.11)
RDW: 14.5 % (ref 11.5–15.5)
WBC: 5.8 10*3/uL (ref 4.0–10.5)
nRBC: 0 % (ref 0.0–0.2)

## 2019-08-11 LAB — I-STAT BETA HCG BLOOD, ED (MC, WL, AP ONLY): I-stat hCG, quantitative: <5 m[IU]/mL (ref <5)

## 2019-08-11 LAB — LIPASE, BLOOD: Lipase: 29 U/L (ref 11–51)

## 2019-08-11 LAB — PREGNANCY, URINE: Preg Test, Ur: NEGATIVE

## 2019-08-11 MED ORDER — ONDANSETRON 8 MG PO TBDP
8.0000 mg | ORAL_TABLET | Freq: Once | ORAL | Status: AC
  Administered 2019-08-11: 8 mg via ORAL
  Filled 2019-08-11: qty 1

## 2019-08-11 MED ORDER — KETOROLAC TROMETHAMINE 30 MG/ML IJ SOLN
30.0000 mg | Freq: Once | INTRAMUSCULAR | Status: AC
  Administered 2019-08-11: 30 mg via INTRAMUSCULAR
  Filled 2019-08-11: qty 1

## 2019-08-11 MED ORDER — MORPHINE SULFATE (PF) 4 MG/ML IV SOLN
4.0000 mg | Freq: Once | INTRAVENOUS | Status: AC
  Administered 2019-08-11: 4 mg via INTRAMUSCULAR
  Filled 2019-08-11: qty 1

## 2019-08-11 MED ORDER — SODIUM CHLORIDE 0.9% FLUSH: 3.0000 mL | Freq: Once | INTRAVENOUS | Status: DC

## 2019-08-11 MED ORDER — OXYCODONE-ACETAMINOPHEN 5-325 MG PO TABS: 1.0000 | ORAL_TABLET | Freq: Four times a day (QID) | ORAL | 0 refills | Status: DC | PRN

## 2019-08-11 MED ORDER — TAMSULOSIN HCL 0.4 MG PO CAPS: 0.4000 mg | ORAL_CAPSULE | Freq: Every day | ORAL | 1 refills | Status: DC

## 2019-08-11 NOTE — ED Triage Notes (Signed)
Pt to ED via POV c/o right flank pain. Pt has hx/o kidney stones. Pt is in NAD.

## 2019-08-11 NOTE — ED Provider Notes (Signed)
[facility] Emergency Department Provider Note  ____________________________________________  Time seen: Approximately 1:31 PM  I have reviewed the triage vital signs and the nursing notes.   HISTORY  Chief Complaint Flank Pain    HPI Vickie Hayes is a 34 y.o. female who presents the emergency department complaining of right flank and right pelvic pain.  Patient states that she began with hematuria, flank pain earlier today.  Symptoms are consistent with her previous kidney stones which were 3 months ago.  Patient denies any fevers or chills.  No anorexia.  Patient states that it is more a radiating pain from her back into her groin/pelvic region.  No vaginal discharge or bleeding.  Patient denies chance of pregnancy.  Again last kidney stone was 3 months ago and pain is similar.  Patient has a history of hypertension, nephrolithiasis.   Patient denies any dysuria, polyuria.  No fevers or chills.  No nausea, vomiting, diarrhea.        Past Medical History:  Diagnosis Date  . Bacterial vaginosis   . Gestational diabetes    pt denies  . History of Papanicolaou smear of cervix 03/23/2013;[id number]   NEG; NEG  . Hypertension   . Medical history non-contributory   . Oligomenorrhea    OFF BC    Patient Active Problem List   Diagnosis Date Noted  . Gestational hypertension w/o significant proteinuria in 3rd trimester 03/31/2017  . Chronic hypertension affecting pregnancy 03/18/2017  . Supervision of high risk pregnancy in third trimester 09/16/2016    Past Surgical History:  Procedure Laterality Date  . NEXPLANON INSERTION  04/23/2013  . [place] REMOVAL  10/2013   CHARLES DREW  . TUBAL LIGATION Bilateral 04/01/2017   Procedure: POST PARTUM TUBAL LIGATION;  Surgeon: Ward, Honor Loh, MD;  Location: [facility] ORS;  Service: Gynecology;  Laterality: Bilateral;  . WISDOM TOOTH EXTRACTION Bilateral     Prior to Admission medications   Medication Sig Start  Date End Date Taking? Authorizing Provider  acetaminophen (TYLENOL) 325 MG tablet Take 2 tablets (650 mg total) by mouth every 6 (six) hours as needed (for pain scale < 4). 04/02/17   McVey, Murray Hodgkins, CNM  azithromycin (ZITHROMAX Z-PAK) 250 MG tablet Take 2 tablets (500 mg) on  Day 1,  followed by 1 tablet (250 mg) once daily on Days 2 through 5. 06/11/18   Earleen Newport, MD  chlorpheniramine-HYDROcodone (TUSSIONEX PENNKINETIC ER) 10-8 MG/5ML SUER Take 5 mLs by mouth 2 (two) times daily. 06/11/18   Earleen Newport, MD  dibucaine (NUPERCAINAL) 1 % OINT Place 1 application rectally as needed for hemorrhoids. 04/02/17   McVey, Murray Hodgkins, CNM  ibuprofen (ADVIL) 800 MG tablet Take 1 tablet (800 mg total) by mouth every 8 (eight) hours as needed for moderate pain. 04/28/19   Paulette Blanch, MD  ondansetron (ZOFRAN ODT) 4 MG disintegrating tablet Take 1 tablet (4 mg total) by mouth every 8 (eight) hours as needed for nausea or vomiting. 04/28/19   Paulette Blanch, MD  oxyCODONE-acetaminophen (PERCOCET/ROXICET) 5-325 MG tablet Take 1 tablet by mouth every 6 (six) hours as needed for severe pain. 08/11/19   Sylas Twombly, Charline Bills, PA-C  Prenatal Vit-Fe Fumarate-FA (PRENATAL MULTIVITAMIN) TABS tablet Take 1 tablet by mouth daily at 12 noon.    [provider]  senna-docusate (SENOKOT-S) 8.6-50 MG tablet Take 2 tablets by mouth at bedtime as needed for mild constipation. 04/02/17   McVey, Murray Hodgkins, CNM  simethicone (Stryker) 80  MG chewable tablet Chew 1 tablet (80 mg total) by mouth as needed for flatulence. 04/02/17   McVey, Prudencio Pair, CNM  tamsulosin (FLOMAX) 0.4 MG CAPS capsule Take 1 capsule (0.4 mg total) by mouth daily. 08/11/19   Cerena Baine, Delorise Royals, PA-C  witch hazel-glycerin (TUCKS) pad Apply 1 application topically as needed for hemorrhoids. 04/02/17   McVey, Prudencio Pair, CNM    Allergies Patient has no known allergies.  Family History  Problem Relation Age of Onset  . HIV Mother   .  Tuberculosis Mother   . Hypertension Mother   . Cancer Maternal Grandmother   . Hypertension Maternal Grandmother   . Diabetes Maternal Grandmother     Social History Social History   Tobacco Use  . Smoking status: Never Smoker  . Smokeless tobacco: Never Used  Substance Use Topics  . Alcohol use: No  . Drug use: No     Review of Systems  Constitutional: No fever/chills Eyes: No visual changes. No discharge ENT: No upper respiratory complaints. Cardiovascular: no chest pain. Respiratory: no cough. No SOB. Gastrointestinal: No abdominal pain.  No nausea, no vomiting.  No diarrhea.  No constipation. Genitourinary: Negative for dysuria.  Positive for flank pain and hematuria Musculoskeletal: Negative for musculoskeletal pain. Skin: Negative for rash, abrasions, lacerations, ecchymosis. Neurological: Negative for headaches, focal weakness or numbness. 10-point ROS otherwise negative.  ____________________________________________   PHYSICAL EXAM:  VITAL SIGNS: ED Triage Vitals  Enc Vitals Group     BP 08/11/19 1115 140/83     Pulse Rate 08/11/19 1115 66     Resp 08/11/19 1115 16     Temp 08/11/19 1115 98 F (36.7 C)     Temp Source 08/11/19 1115 Oral     SpO2 08/11/19 1115 100 %     Weight 08/11/19 1115 160 lb (72.6 kg)     Height 08/11/19 1115 5\' 3"  (1.6 m)     Head Circumference --      Peak Flow --      Pain Score 08/11/19 1123 5     Pain Loc --      Pain Edu? --      Excl. in GC? --      Constitutional: Alert and oriented. Well appearing and in no acute distress. Eyes: Conjunctivae are normal. PERRL. EOMI. Head: Atraumatic. ENT:      Ears:       Nose: No congestion/rhinnorhea.      Mouth/Throat: Mucous membranes are moist.  Neck: No stridor.    Cardiovascular: Normal rate, regular rhythm. Normal S1 and S2.  Good peripheral circulation. Respiratory: Normal respiratory effort without tachypnea or retractions. Lungs CTAB. Good air entry to the bases with  no decreased or absent breath sounds. Gastrointestinal: Bowel sounds 4 quadrants. Soft and nontender to palpation all quadrants.  Specifically no tenderness in the right lower quadrant.  No rebound tenderness.. No guarding or rigidity. No palpable masses. No distention. No CVA tenderness. Musculoskeletal: Full range of motion to all extremities. No gross deformities appreciated. Neurologic:  Normal speech and language. No gross focal neurologic deficits are appreciated.  Skin:  Skin is warm, dry and intact. No rash noted. Psychiatric: Mood and affect are normal. Speech and behavior are normal. Patient exhibits appropriate insight and judgement.   ____________________________________________   LABS (all labs ordered are listed, but only abnormal results are displayed)  Labs Reviewed  URINALYSIS, COMPLETE (UACMP) WITH MICROSCOPIC - Abnormal; Notable for the following components:      Result  Value   Color, Urine YELLOW (*)    APPearance HAZY (*)    Hgb urine dipstick MODERATE (*)    Bacteria, UA RARE (*)    All other components within normal limits  PREGNANCY, URINE  POC URINE PREG, ED   ____________________________________________  EKG   ____________________________________________  RADIOLOGY I personally viewed and evaluated these images as part of my medical decision making, as well as reviewing the written report by the radiologist.  CT Renal Stone Study  Result Date: 08/11/2019 CLINICAL DATA:  Right flank pain. EXAM: CT ABDOMEN AND PELVIS WITHOUT CONTRAST TECHNIQUE: Multidetector CT imaging of the abdomen and pelvis was performed following the standard protocol without IV contrast. COMPARISON:  April 28, 2019 FINDINGS: Lower chest: No acute abnormality. Hepatobiliary: No focal liver abnormality is seen. No gallstones, gallbladder wall thickening, or biliary dilatation. Pancreas: Unremarkable. No pancreatic ductal dilatation or surrounding inflammatory changes. Spleen: Normal  in size without focal abnormality. Adrenals/Urinary Tract: Adrenal glands are unremarkable. Kidneys are normal in size, without focal lesions. A 6 mm obstructing renal stone is seen at the right UVJ with mild to moderate severity right-sided hydronephrosis and hydroureter. A 3 mm nonobstructing renal stone is seen within the mid to lower right kidney. A 6 mm nonobstructing renal stone is seen within the mid to lower left kidney. Bladder is unremarkable. Stomach/Bowel: Stomach is within normal limits. Appendix appears normal. No evidence of bowel wall thickening, distention, or inflammatory changes. Vascular/Lymphatic: No significant vascular findings are present. No enlarged abdominal or pelvic lymph nodes. Reproductive: Uterus and bilateral adnexa are unremarkable. Other: No abdominal wall hernia or abnormality. No abdominopelvic ascites. Musculoskeletal: No acute or significant osseous findings. IMPRESSION: 1. 6 mm obstructing renal stone at the right UVJ with mild to moderate severity right-sided hydronephrosis and hydroureter. 2. Bilateral nonobstructing renal calculi. Electronically Signed   By: Aram Candela M.D.   On: 08/11/2019 15:48    ____________________________________________    PROCEDURES  Procedure(s) performed:    Procedures    Medications  morphine 4 MG/ML injection 4 mg (has no administration in time range)  ketorolac (TORADOL) 30 MG/ML injection 30 mg (has no administration in time range)  ondansetron (ZOFRAN-ODT) disintegrating tablet 8 mg (has no administration in time range)     ____________________________________________   INITIAL IMPRESSION / ASSESSMENT AND PLAN / ED COURSE  Pertinent labs & imaging results that were available during my care of the patient were reviewed by me and considered in my medical decision making (see chart for details).  Review of the [place] CSRS was performed in accordance of the [facility] prior to dispensing any controlled  drugs.  Clinical Course as of Aug 11 1650  Sat Aug 11, 2019  1650 Patient presented to emergency department complaining of right-sided flank pain with hematuria.  CT reveals obstructing UVJ stone.  Talked with Dr. Alvester Morin with nephrology and he advised that the patient may receive Toradol at this time.  Follow-up with urology for lithotripsy.  Patient will be prescribed oxycodone and Flomax.   [JC]    Clinical Course User Index [JC] Lenwood Balsam, Delorise Royals, PA-C          Patient's diagnosis is consistent with obstructing nephrolithiasis.  Patient presented to emergency department flank pain, hematuria.  History of nephrolithiasis 3 months ago.  Patient had CT which revealed obstructing UVJ stone.  Discussed the case with on-call urologist, Dr. Alvester Morin.  Dr. Alvester Morin advised that the patient may receive Toradol at this time.  Follow-up for lithotripsy.  Patient will be given Percocet, Flomax for symptom relief.  She will follow up with urology..  Patient is given ED precautions to return to the ED for any worsening or new symptoms.     ____________________________________________  FINAL CLINICAL IMPRESSION(S) / ED DIAGNOSES  Final diagnoses:  Nephrolithiasis      NEW MEDICATIONS STARTED DURING THIS VISIT:  ED Discharge Orders         Ordered    oxyCODONE-acetaminophen (PERCOCET/ROXICET) 5-325 MG tablet  Every 6 hours PRN     08/11/19 1649    tamsulosin (FLOMAX) 0.4 MG CAPS capsule  Daily     08/11/19 1649              This chart was dictated using voice recognition software/Dragon. Despite best efforts to proofread, errors can occur which can change the meaning. Any change was purely unintentional.    Racheal Patches, PA-C 08/11/19 1652    Dionne Bucy, MD 08/12/19 [phone]

## 2019-08-11 NOTE — ED Triage Notes (Signed)
Patient complains of right side pain since am. Reports similar pain several months ago after being diagnosed with kidney stone

## 2019-08-15 ENCOUNTER — Other Ambulatory Visit: Payer: Self-pay

## 2019-08-15 ENCOUNTER — Ambulatory Visit
Admission: RE | Admit: 2019-08-15 | Discharge: 2019-08-15 | Disposition: A | Discharge status: Home / Self Care | Payer: Self-pay | Source: Ambulatory Visit | Attending: Urology | Admitting: Urology

## 2019-08-15 ENCOUNTER — Other Ambulatory Visit: Payer: Self-pay | Admitting: Radiology

## 2019-08-15 ENCOUNTER — Ambulatory Visit (INDEPENDENT_AMBULATORY_CARE_PROVIDER_SITE_OTHER): Payer: Self-pay | Admitting: Urology

## 2019-08-15 ENCOUNTER — Encounter: Payer: Self-pay | Admitting: Urology

## 2019-08-15 VITALS — BP 146/93 | HR 80 | Ht 63.0 in | Wt 172.5 lb

## 2019-08-15 DIAGNOSIS — N201 Calculus of ureter: Secondary | ICD-10-CM | POA: Insufficient documentation

## 2019-08-15 DIAGNOSIS — R102 Pelvic and perineal pain: Secondary | ICD-10-CM | POA: Insufficient documentation

## 2019-08-15 DIAGNOSIS — A599 Trichomoniasis, unspecified: Secondary | ICD-10-CM

## 2019-08-15 MED ORDER — METRONIDAZOLE 500 MG PO TABS: 2000.0000 mg | ORAL_TABLET | Freq: Once | ORAL | 0 refills | Status: AC

## 2019-08-15 NOTE — Progress Notes (Signed)
08/15/19 4:18 PM   Vickie Hayes 1985-03-23 [id number]  CC: Right flank and groin pain  HPI: I saw Vickie Hayes as an add-on in urology clinic today for evaluation of right flank and groin pain.  She is a 34 year old female that originally presented to the ED back in February 2021 with right-sided abdominal pain and nausea and was found to have a 6 mm right distal ureteral stone with upstream hydronephrosis.  Urinalysis was benign at that time and she was discharged with medical expulsive therapy and urology follow-up, however she did not follow-up.  She reports she has had intermittent abdominal pain, right-sided pain, and urinary symptoms of urgency and frequency over the last few months.  This culminated in worsening pain over the last week and she presented to [facility] ED on 08/11/2019 and repeat CT showed the persistent 6 mm right distal ureteral stone with upstream hydronephrosis and urinalysis was again relatively benign appearing without evidence of infection, and renal function was normal with creatinine of 0.74.  She had persistent pain and then presented to the [facility] ED on 08/12/2019 and in and out catheterization showed only mixed urogenital flora, however she had trichomonas on her urinalysis.  She was sent home with Bactrim and oral pain control and instructed to follow-up with urology.  She came to clinic today with ongoing severe pain and nausea over the last few days.  She denies any fevers or chills.  She denies any UTIs over the last 6 months.  Pregnancy test was negative 08/12/2019.  Stone measures 6 mm, clearly seen on KUB today, 750HU, and 10 cm kidney stone distance.  There is a small right lower pole stone, and a small left lower pole stone.  PMH: Past Medical History:  Diagnosis Date  . Bacterial vaginosis   . Gestational diabetes    pt denies  . History of Papanicolaou smear of cervix 03/23/2013;10102017   NEG; NEG  . Hypertension   . Kidney stone   .  Medical history non-contributory   . Oligomenorrhea    OFF BC    Surgical History: Past Surgical History:  Procedure Laterality Date  . NEXPLANON INSERTION  04/23/2013  . [place] REMOVAL  10/2013   CHARLES DREW  . TUBAL LIGATION Bilateral 04/01/2017   Procedure: POST PARTUM TUBAL LIGATION;  Surgeon: Ward, Honor Loh, MD;  Location: [facility] ORS;  Service: Gynecology;  Laterality: Bilateral;  . WISDOM TOOTH EXTRACTION Bilateral     Family History: Family History  Problem Relation Age of Onset  . HIV Mother   . Tuberculosis Mother   . Hypertension Mother   . Cancer Maternal Grandmother   . Hypertension Maternal Grandmother   . Diabetes Maternal Grandmother     Social History:  reports that she has never smoked. She has never used smokeless tobacco. She reports that she does not drink alcohol or use drugs.  Physical Exam: BP (!) 146/93 (BP Location: Left Arm, Patient Position: Sitting, Cuff Size: Normal)   Pulse 80   Ht 5\' 3"  (1.6 m)   Wt 172 lb 8 oz (78.2 kg)   BMI 30.56 kg/m    Constitutional:  Alert and oriented, No acute distress. Cardiovascular: No clubbing, cyanosis, or edema. Respiratory: Normal respiratory effort, no increased work of breathing. GI: Abdomen is soft, nontender, nondistended, no abdominal masses GU: Right CVA tenderness  Laboratory Data: Reviewed, see HPI Urine culture 6/6 mixed urogenital flora  Pertinent Imaging: I personally reviewed the KUB from today and CT,  see HPI for details.  6 mm right distal ureteral stone with upstream hydronephrosis, clearly seen on KUB, 10cm skin to stone distance,750HU.  Assessment & Plan:   In summary, she is a 34 year old female with a 6 mm right distal ureteral stone present since at least February 2021.  She has been lost to follow-up numerous times.  She has had worsening of her right-sided pain and urinary symptoms over the last few days culminating in multiple ED visits.  Urine culture from 6/6 showed only mixed  urogenital flora.  She has no clinical or laboratory signs of infection.  We discussed various treatment options for urolithiasis including observation with or without medical expulsive therapy, shockwave lithotripsy (SWL), ureteroscopy and laser lithotripsy with stent placement, and percutaneous nephrolithotomy.  We discussed that management is based on stone size, location, density, patient co-morbidities, and patient preference.   Stones <45mm in size have a >80% spontaneous passage rate. Data surrounding the use of tamsulosin for medical expulsive therapy is controversial, but meta analyses suggests it is most efficacious for distal stones between 5-71mm in size. Possible side effects include dizziness/lightheadedness, and retrograde ejaculation.  SWL has a lower stone free rate in a single procedure, but also a lower complication rate compared to ureteroscopy and avoids a stent and associated stent related symptoms. Possible complications include renal hematoma, steinstrasse, and need for additional treatment.  Ureteroscopy with laser lithotripsy and stent placement has a higher stone free rate than SWL in a single procedure, however increased complication rate including possible infection, ureteral injury, bleeding, and stent related morbidity. Common stent related symptoms include dysuria, urgency/frequency, and flank pain.  After an extensive discussion of the risks and benefits of the above treatment options, the patient would like to proceed with right shockwave lithotripsy.  Schedule right shockwave lithotripsy tomorrow with Dr. Apolinar Junes Metronidazole 2 g x 1 dose for trichomonas in urine  Legrand Rams, MD 08/15/2019  [facility] [address], [address] [place], [place] [address] [phone]

## 2019-08-15 NOTE — H&P (View-Only) (Signed)
 08/15/19 4:18 PM   Vickie Hayes 07/11/1985 [id number]  CC: Right flank and groin pain  HPI: I saw Ms. Schissler as an add-on in urology clinic today for evaluation of right flank and groin pain.  She is a 34-year-old female that originally presented to the ED back in February 2021 with right-sided abdominal pain and nausea and was found to have a 6 mm right distal ureteral stone with upstream hydronephrosis.  Urinalysis was benign at that time and she was discharged with medical expulsive therapy and urology follow-up, however she did not follow-up.  She reports she has had intermittent abdominal pain, right-sided pain, and urinary symptoms of urgency and frequency over the last few months.  This culminated in worsening pain over the last week and she presented to the [facility] ED on 08/11/2019 and repeat CT showed the persistent 6 mm right distal ureteral stone with upstream hydronephrosis and urinalysis was again relatively benign appearing without evidence of infection, and renal function was normal with creatinine of 0.74.  She had persistent pain and then presented to the [facility] ED on 08/12/2019 and in and out catheterization showed only mixed urogenital flora, however she had trichomonas on her urinalysis.  She was sent home with Bactrim and oral pain control and instructed to follow-up with urology.  She came to clinic today with ongoing severe pain and nausea over the last few days.  She denies any fevers or chills.  She denies any UTIs over the last 6 months.  Pregnancy test was negative 08/12/2019.  Stone measures 6 mm, clearly seen on KUB today, 750HU, and 10 cm kidney stone distance.  There is a small right lower pole stone, and a small left lower pole stone.  PMH: Past Medical History:  Diagnosis Date  . Bacterial vaginosis   . Gestational diabetes    pt denies  . History of Papanicolaou smear of cervix 03/23/2013;[id number]   NEG; NEG  . Hypertension   . Kidney stone   .  Medical history non-contributory   . Oligomenorrhea    OFF BC    Surgical History: Past Surgical History:  Procedure Laterality Date  . NEXPLANON INSERTION  04/23/2013  . NEXPLANON REMOVAL  10/2013   CHARLES DREW  . TUBAL LIGATION Bilateral 04/01/2017   Procedure: POST PARTUM TUBAL LIGATION;  Surgeon: Ward, Chelsea C, MD;  Location: [facility] ORS;  Service: Gynecology;  Laterality: Bilateral;  . WISDOM TOOTH EXTRACTION Bilateral     Family History: Family History  Problem Relation Age of Onset  . HIV Mother   . Tuberculosis Mother   . Hypertension Mother   . Cancer Maternal Grandmother   . Hypertension Maternal Grandmother   . Diabetes Maternal Grandmother     Social History:  reports that she has never smoked. She has never used smokeless tobacco. She reports that she does not drink alcohol or use drugs.  Physical Exam: BP (!) 146/93 (BP Location: Left Arm, Patient Position: Sitting, Cuff Size: Normal)   Pulse 80   Ht 5' 3" (1.6 m)   Wt 172 lb 8 oz (78.2 kg)   BMI 30.56 kg/m    Constitutional:  Alert and oriented, No acute distress. Cardiovascular: No clubbing, cyanosis, or edema. Respiratory: Normal respiratory effort, no increased work of breathing. GI: Abdomen is soft, nontender, nondistended, no abdominal masses GU: Right CVA tenderness  Laboratory Data: Reviewed, see HPI Urine culture 6/6 mixed urogenital flora  Pertinent Imaging: I personally reviewed the KUB from today and CT,   see HPI for details.  6 mm right distal ureteral stone with upstream hydronephrosis, clearly seen on KUB, 10cm skin to stone distance,750HU.  Assessment & Plan:   In summary, she is a 34 year old female with a 6 mm right distal ureteral stone present since at least February 2021.  She has been lost to follow-up numerous times.  She has had worsening of her right-sided pain and urinary symptoms over the last few days culminating in multiple ED visits.  Urine culture from 6/6 showed only mixed  urogenital flora.  She has no clinical or laboratory signs of infection.  We discussed various treatment options for urolithiasis including observation with or without medical expulsive therapy, shockwave lithotripsy (SWL), ureteroscopy and laser lithotripsy with stent placement, and percutaneous nephrolithotomy.  We discussed that management is based on stone size, location, density, patient co-morbidities, and patient preference.   Stones <45mm in size have a >80% spontaneous passage rate. Data surrounding the use of tamsulosin for medical expulsive therapy is controversial, but meta analyses suggests it is most efficacious for distal stones between 5-71mm in size. Possible side effects include dizziness/lightheadedness, and retrograde ejaculation.  SWL has a lower stone free rate in a single procedure, but also a lower complication rate compared to ureteroscopy and avoids a stent and associated stent related symptoms. Possible complications include renal hematoma, steinstrasse, and need for additional treatment.  Ureteroscopy with laser lithotripsy and stent placement has a higher stone free rate than SWL in a single procedure, however increased complication rate including possible infection, ureteral injury, bleeding, and stent related morbidity. Common stent related symptoms include dysuria, urgency/frequency, and flank pain.  After an extensive discussion of the risks and benefits of the above treatment options, the patient would like to proceed with right shockwave lithotripsy.  Schedule right shockwave lithotripsy tomorrow with Dr. Apolinar Junes Metronidazole 2 g x 1 dose for trichomonas in urine  Legrand Rams, MD 08/15/2019  [facility] [address], [address] [place], [place] [address] [phone]

## 2019-08-15 NOTE — Patient Instructions (Signed)
Lithotripsy  Lithotripsy is a treatment that can sometimes help eliminate kidney stones and the pain that they cause. A form of lithotripsy, also known as extracorporeal shock wave lithotripsy, is a nonsurgical procedure that crushes a kidney stone with shock waves. These shock waves pass through your body and focus on the kidney stone. They cause the kidney stone to break up while it is still in the urinary tract. This makes it easier for the smaller pieces of stone to pass in the urine. Tell a health care provider about:  Any allergies you have.  All medicines you are taking, including vitamins, herbs, eye drops, creams, and over-the-counter medicines.  Any blood disorders you have.  Any surgeries you have had.  Any medical conditions you have.  Whether you are pregnant or may be pregnant.  Any problems you or family members have had with anesthetic medicines. What are the risks? Generally, this is a safe procedure. However, problems may occur, including:  Infection.  Bleeding of the kidney.  Bruising of the kidney or skin.  Scarring of the kidney, which can lead to: ? Increased blood pressure. ? Poor kidney function. ? Return (recurrence) of kidney stones.  Damage to other structures or organs, such as the liver, colon, spleen, or pancreas.  Blockage (obstruction) of the the tube that carries urine from the kidney to the bladder (ureter).  Failure of the kidney stone to break into pieces (fragments). What happens before the procedure? Staying hydrated Follow instructions from your health care provider about hydration, which may include:  Up to 2 hours before the procedure - you may continue to drink clear liquids, such as water, clear fruit juice, black coffee, and plain tea. Eating and drinking restrictions Follow instructions from your health care provider about eating and drinking, which may include:  8 hours before the procedure - stop eating heavy meals or foods  such as meat, fried foods, or fatty foods.  6 hours before the procedure - stop eating light meals or foods, such as toast or cereal.  6 hours before the procedure - stop drinking milk or drinks that contain milk.  2 hours before the procedure - stop drinking clear liquids. General instructions  Plan to have someone take you home from the hospital or clinic.  Ask your health care provider about: ? Changing or stopping your regular medicines. This is especially important if you are taking diabetes medicines or blood thinners. ? Taking medicines such as aspirin and ibuprofen. These medicines and other NSAIDs can thin your blood. Do not take these medicines for 7 days before your procedure if your health care provider instructs you not to.  You may have tests, such as: ? Blood tests. ? Urine tests. ? Imaging tests, such as a CT scan. What happens during the procedure?  To lower your risk of infection: ? Your health care team will wash or sanitize their hands. ? Your skin will be washed with soap.  An IV tube will be inserted into one of your veins. This tube will give you fluids and medicines.  You will be given one or more of the following: ? A medicine to help you relax (sedative). ? A medicine to make you fall asleep (general anesthetic).  A water-filled cushion may be placed behind your kidney or on your abdomen. In some cases you may be placed in a tub of lukewarm water.  Your body will be positioned in a way that makes it easy to target the kidney   stone.  A flexible tube with holes in it (stent) may be placed in the ureter. This will help keep urine flowing from the kidney if the fragments of the stone have been blocking the ureter.  An X-ray or ultrasound exam will be done to locate your stone.  Shock waves will be aimed at the stone. If you are awake, you may feel a tapping sensation as the shock waves pass through your body. The procedure may vary among health care  providers and hospitals. What happens after the procedure?  You may have an X-ray to see whether the procedure was able to break up the kidney stone and how much of the stone has passed. If large stone fragments remain after treatment, you may need to have a second procedure at a later time.  Your blood pressure, heart rate, breathing rate, and blood oxygen level will be monitored until the medicines you were given have worn off.  You may be given antibiotics or pain medicine as needed.  If a stent was placed in your ureter during surgery, it may stay in place for a few weeks.  You may need strain your urine to collect pieces of the kidney stone for testing.  You will need to drink plenty of water.  Do not drive for 24 hours if you were given a sedative. Summary  Lithotripsy is a treatment that can sometimes help eliminate kidney stones and the pain that they cause.  A form of lithotripsy, also known as extracorporeal shock wave lithotripsy, is a nonsurgical procedure that crushes a kidney stone with shock waves.  Generally, this is a safe procedure. However, problems may occur, including damage to the kidney or other organs, infection, or obstruction of the tube that carries urine from the kidney to the bladder (ureter).  When you go home, you will need to drink plenty of water. You may be asked to strain your urine to collect pieces of the kidney stone for testing. This information is not intended to replace advice given to you by your health care provider. Make sure you discuss any questions you have with your health care provider. Document Revised: 06/05/2018 Document Reviewed: 01/14/2016 Elsevier Patient Education  2020 Elsevier Inc.  

## 2019-08-16 ENCOUNTER — Ambulatory Visit
Admission: RE | Admit: 2019-08-16 | Discharge: 2019-08-16 | Disposition: A | Discharge status: Home / Self Care | Payer: Self-pay | Attending: Urology | Admitting: Urology

## 2019-08-16 ENCOUNTER — Other Ambulatory Visit: Payer: Self-pay | Admitting: Radiology

## 2019-08-16 ENCOUNTER — Encounter
Admission: RE | Disposition: A | Discharge status: Home / Self Care | Payer: Self-pay | Source: Home / Self Care | Attending: Urology

## 2019-08-16 ENCOUNTER — Encounter: Payer: Self-pay | Admitting: Radiology

## 2019-08-16 ENCOUNTER — Other Ambulatory Visit
Admission: RE | Admit: 2019-08-16 | Discharge: 2019-08-16 | Disposition: A | Discharge status: Home / Self Care | Payer: Self-pay | Source: Ambulatory Visit | Attending: Urology | Admitting: Urology

## 2019-08-16 ENCOUNTER — Ambulatory Visit: Payer: Self-pay

## 2019-08-16 DIAGNOSIS — N132 Hydronephrosis with renal and ureteral calculous obstruction: Secondary | ICD-10-CM | POA: Insufficient documentation

## 2019-08-16 DIAGNOSIS — N201 Calculus of ureter: Secondary | ICD-10-CM

## 2019-08-16 DIAGNOSIS — Z87442 Personal history of urinary calculi: Secondary | ICD-10-CM | POA: Insufficient documentation

## 2019-08-16 DIAGNOSIS — I10 Essential (primary) hypertension: Secondary | ICD-10-CM | POA: Insufficient documentation

## 2019-08-16 DIAGNOSIS — Z20822 Contact with and (suspected) exposure to covid-19: Secondary | ICD-10-CM | POA: Insufficient documentation

## 2019-08-16 DIAGNOSIS — A5900 Urogenital trichomoniasis, unspecified: Secondary | ICD-10-CM | POA: Insufficient documentation

## 2019-08-16 HISTORY — PX: EXTRACORPOREAL SHOCK WAVE LITHOTRIPSY: SHX1557

## 2019-08-16 LAB — SARS CORONAVIRUS 2 BY RT PCR (HOSPITAL ORDER, PERFORMED IN ~~LOC~~ HOSPITAL LAB): SARS Coronavirus 2: NEGATIVE

## 2019-08-16 SURGERY — LITHOTRIPSY, ESWL
Anesthesia: Moderate Sedation | Laterality: Left

## 2019-08-16 MED ORDER — DIPHENHYDRAMINE HCL 25 MG PO CAPS
ORAL_CAPSULE | ORAL | Status: AC
  Administered 2019-08-16: 25 mg via ORAL
  Filled 2019-08-16: qty 1

## 2019-08-16 MED ORDER — DIPHENHYDRAMINE HCL 25 MG PO CAPS: 25.0000 mg | ORAL_CAPSULE | ORAL | Status: AC

## 2019-08-16 MED ORDER — CIPROFLOXACIN HCL 500 MG PO TABS
ORAL_TABLET | ORAL | Status: AC
  Administered 2019-08-16: 500 mg via ORAL
  Filled 2019-08-16: qty 1

## 2019-08-16 MED ORDER — SODIUM CHLORIDE 0.9 % IV SOLN: INTRAVENOUS | Status: DC

## 2019-08-16 MED ORDER — DIAZEPAM 5 MG PO TABS: 10.0000 mg | ORAL_TABLET | ORAL | Status: AC

## 2019-08-16 MED ORDER — ONDANSETRON HCL 4 MG/2ML IJ SOLN
INTRAMUSCULAR | Status: AC
  Administered 2019-08-16: 4 mg via INTRAVENOUS
  Filled 2019-08-16: qty 2

## 2019-08-16 MED ORDER — ONDANSETRON HCL 4 MG/2ML IJ SOLN: 4.0000 mg | Freq: Once | INTRAMUSCULAR | Status: AC | PRN

## 2019-08-16 MED ORDER — CIPROFLOXACIN HCL 500 MG PO TABS: 500.0000 mg | ORAL_TABLET | ORAL | Status: AC

## 2019-08-16 MED ORDER — DIAZEPAM 5 MG PO TABS
ORAL_TABLET | ORAL | Status: AC
  Administered 2019-08-16: 10 mg via ORAL
  Filled 2019-08-16: qty 2

## 2019-08-16 NOTE — Interval H&P Note (Signed)
History and Physical Interval Note:  08/16/2019 4:26 PM  [name]  has presented today for surgery, with the diagnosis of Kidney stone.  The various methods of treatment have been discussed with the patient and family. After consideration of risks, benefits and other options for treatment, the patient has consented to  Procedure(s): EXTRACORPOREAL SHOCK WAVE LITHOTRIPSY (ESWL) (Left) as a surgical intervention.  The patient's history has been reviewed, patient examined, no change in status, stable for surgery.  I have reviewed the patient's chart and labs.  Questions were answered to the patient's satisfaction.    RRR CTAB   Vanna Scotland

## 2019-08-16 NOTE — Discharge Instructions (Signed)
AMBULATORY SURGERY  DISCHARGE INSTRUCTIONS   1) The drugs that you were given will stay in your system until tomorrow so for the next 24 hours you should not:  A) Drive an automobile B) Make any legal decisions C) Drink any alcoholic beverage   2) You may resume regular meals tomorrow.  Today it is better to start with liquids and gradually work up to solid foods.  You may eat anything you prefer, but it is better to start with liquids, then soup and crackers, and gradually work up to solid foods.   3) Please notify your doctor immediately if you have any unusual bleeding, trouble breathing, redness and pain at the surgery site, drainage, fever, or pain not relieved by medication.    4) Additional Instructions: please follow the litho discharge sheet for instuctions on home care        Please contact your physician with any problems or Same Day Surgery at [phone], Monday through Friday 6 am to 4 pm, or [place] at [facility] number at [phone].

## 2019-08-17 ENCOUNTER — Encounter: Payer: Self-pay | Admitting: Urology

## 2019-08-21 ENCOUNTER — Other Ambulatory Visit: Payer: Self-pay

## 2019-08-21 MED ORDER — METRONIDAZOLE 500 MG PO TABS: 2000.0000 mg | ORAL_TABLET | Freq: Once | ORAL | 0 refills | Status: AC

## 2019-08-30 ENCOUNTER — Ambulatory Visit
Admission: RE | Admit: 2019-08-30 | Discharge: 2019-08-30 | Disposition: A | Discharge status: Home / Self Care | Payer: Self-pay | Attending: Urology | Admitting: Urology

## 2019-08-30 ENCOUNTER — Ambulatory Visit (INDEPENDENT_AMBULATORY_CARE_PROVIDER_SITE_OTHER): Payer: Self-pay | Admitting: Physician Assistant

## 2019-08-30 ENCOUNTER — Ambulatory Visit
Admission: RE | Admit: 2019-08-30 | Discharge: 2019-08-30 | Disposition: A | Discharge status: Home / Self Care | Payer: Self-pay | Source: Ambulatory Visit | Attending: Urology | Admitting: Urology

## 2019-08-30 ENCOUNTER — Other Ambulatory Visit: Payer: Self-pay

## 2019-08-30 VITALS — BP 127/84 | HR 90 | Wt 172.0 lb

## 2019-08-30 DIAGNOSIS — N201 Calculus of ureter: Secondary | ICD-10-CM | POA: Insufficient documentation

## 2019-08-30 DIAGNOSIS — A599 Trichomoniasis, unspecified: Secondary | ICD-10-CM

## 2019-08-30 NOTE — Progress Notes (Signed)
08/30/2019 1:16 PM   Vinia N [place] Nov 03, 1985 [id number]  CC: Chief Complaint  Patient presents with  . Follow-up    HPI: [place] AYDIA MAJ is a 34 y.o. female s/p ESWL on 08/16/2019 for management of a 6 mm distal right ureteral stone who presents today for stone follow-up.  Additionally, she has a recent diagnosis of trichomoniasis and was prescribed metronidazole 2 g x 1 dose for treatment by Dr. Richardo Hanks.  Today, patient reports feeling well.  She has passed multiple fragments and brings some with her to clinic today for analysis.  She denies persistent flank pain, groin pain, nausea, or gross hematuria.  Additionally, she reports completing prescribed metronidazole around 08/21/2019.  KUB today reveals resolution of the distal right ureteral stone.  In-office UA today positive for 3+ blood; urine microscopy pan-negative including no trichomonas.   PMH: Past Medical History:  Diagnosis Date  . Bacterial vaginosis   . Gestational diabetes    pt denies  . History of Papanicolaou smear of cervix 03/23/2013;[id number]   NEG; NEG  . Hypertension   . Kidney stone   . Medical history non-contributory   . Oligomenorrhea    OFF BC    Surgical History: Past Surgical History:  Procedure Laterality Date  . EXTRACORPOREAL SHOCK WAVE LITHOTRIPSY Left 08/16/2019   Procedure: EXTRACORPOREAL SHOCK WAVE LITHOTRIPSY (ESWL);  Surgeon: Vanna Scotland, MD;  Location: [facility] ORS;  Service: Urology;  Laterality: Left;  . NEXPLANON INSERTION  04/23/2013  . NEXPLANON REMOVAL  10/2013   [facility] D[facility]W  . TUBAL LIGATION Bilateral 04/01/2017   Procedure: POST PARTUM TUBAL LIGATION;  Surgeon: Ward, Elenora Fender, MD;  Location: [facility] ORS;  Service: Gynecology;  Laterality: Bilateral;  . WISDOM TOOTH EXTRACTION Bilateral     Home Medications:  Allergies as of 08/30/2019   No Known Allergies     Medication List       Accurate as of August 30, 2019  1:16 PM. If you have any questions, ask your nurse  or doctor.        ondansetron 4 MG disintegrating tablet Commonly known as: Zofran ODT Take 1 tablet (4 mg total) by mouth every 8 (eight) hours as needed for nausea or vomiting.   oxybutynin 5 MG tablet Commonly known as: DITROPAN Take by mouth.   oxyCODONE-acetaminophen 5-325 MG tablet Commonly known as: PERCOCET/ROXICET Take 1 tablet by mouth every 6 (six) hours as needed for severe pain.   tamsulosin 0.4 MG Caps capsule Commonly known as: FLOMAX Take 1 capsule (0.4 mg total) by mouth daily.       Allergies:  No Known Allergies  Family History: Family History  Problem Relation Age of Onset  . HIV Mother   . Tuberculosis Mother   . Hypertension Mother   . Cancer Maternal Grandmother   . Hypertension Maternal Grandmother   . Diabetes Maternal Grandmother     Social History:   reports that she has never smoked. She has never used smokeless tobacco. She reports that she does not drink alcohol and does not use drugs.  Physical Exam: LMP 08/28/2019   Constitutional:  Alert and oriented, no acute distress, nontoxic appearing HEENT: [place], AT Cardiovascular: No clubbing, cyanosis, or edema Respiratory: Normal respiratory effort, no increased work of breathing Skin: No rashes, bruises or suspicious lesions Neurologic: Grossly intact, no focal deficits, moving all 4 extremities Psychiatric: Normal mood and affect  Laboratory Data: Results for orders placed or performed in visit on 08/30/19  Microscopic Examination  Urine  Result Value Ref Range   WBC, UA 0-5 0 - 5 /hpf   RBC 0-2 0 - 2 /hpf   Epithelial Cells (non renal) None seen 0 - 10 /hpf   Bacteria, UA None seen None seen/Few  Urinalysis, Complete  Result Value Ref Range   Specific Gravity, UA 1.020 1.005 - 1.030   pH, UA 5.5 5.0 - 7.5   Color, UA Yellow Yellow   Appearance Ur Hazy (A) Clear   Leukocytes,UA Negative Negative   Protein,UA Negative Negative/Trace   Glucose, UA Negative Negative   Ketones,  UA Negative Negative   RBC, UA 3+ (A) Negative   Bilirubin, UA Negative Negative   Urobilinogen, Ur 0.2 0.2 - 1.0 mg/dL   Nitrite, UA Negative Negative   Microscopic Examination See below:    Pertinent Imaging: KUB, 08/30/2019: CLINICAL DATA:  Status post lithotripsy 2 weeks ago.  EXAM: ABDOMEN - 1 VIEW  COMPARISON:  August 16, 2019  FINDINGS: The bowel gas pattern is normal. A large amount of stool is seen throughout the colon. A 5 mm soft tissue calcification is seen projecting over the expected region of the lower pole of the left kidney. The 6 mm soft tissue calcification seen within lower right hemipelvis on the prior study is no longer present. A stable, ill-defined 5 mm left pelvic soft tissue calcification is seen.  IMPRESSION: 1. 5 mm renal calculus projecting over the expected region of the lower pole of the left kidney.   Electronically Signed   By: Virgina Norfolk M.D.   On: 08/31/2019 19:25  I personally reviewed the images referenced above and note resolution of the distal right ureteral stone.  Assessment & Plan:   1. Ureteral stone Stone clearance per KUB today, UA reassuring for retained fragments.  Will send fragments for analysis today and contact the patient via MyChart with stone prevention recommendations as indicated.    I reminded the patient that she has bilateral residual nephrolithiasis.  She prefers to follow-up with [place] on an as-needed basis moving forward.  I am in agreement with this plan. - Calculi, with Photograph - Urinalysis, Complete  2. Trichomoniasis Empirically treated, no persistence on UA microscopy today.  No further intervention indicated.  Return if symptoms worsen or fail to improve.  Debroah Loop, PA-C  [facility] [address], [place], [place] [address] [phone]

## 2019-08-31 LAB — URINALYSIS, COMPLETE
Bilirubin, UA: NEGATIVE
Glucose, UA: NEGATIVE
Ketones, UA: NEGATIVE
Leukocytes,UA: NEGATIVE
Nitrite, UA: NEGATIVE
Protein,UA: NEGATIVE
Specific Gravity, UA: 1.02 (ref 1.005–1.030)
Urobilinogen, Ur: 0.2 mg/dL (ref 0.2–1.0)
pH, UA: 5.5 (ref 5.0–7.5)

## 2019-08-31 LAB — MICROSCOPIC EXAMINATION
Bacteria, UA: NONE SEEN
Epithelial Cells (non renal): NONE SEEN /hpf (ref 0–10)

## 2019-09-14 ENCOUNTER — Emergency Department (HOSPITAL_COMMUNITY)
Admission: EM | Admit: 2019-09-14 | Discharge: 2019-09-14 | Disposition: A | Discharge status: Home / Self Care | Payer: Self-pay | Attending: Emergency Medicine | Admitting: Emergency Medicine

## 2019-09-14 ENCOUNTER — Emergency Department (HOSPITAL_COMMUNITY): Payer: Self-pay

## 2019-09-14 ENCOUNTER — Encounter (HOSPITAL_COMMUNITY): Payer: Self-pay | Admitting: Emergency Medicine

## 2019-09-14 DIAGNOSIS — Z79899 Other long term (current) drug therapy: Secondary | ICD-10-CM | POA: Insufficient documentation

## 2019-09-14 DIAGNOSIS — I10 Essential (primary) hypertension: Secondary | ICD-10-CM | POA: Insufficient documentation

## 2019-09-14 DIAGNOSIS — R1032 Left lower quadrant pain: Secondary | ICD-10-CM | POA: Insufficient documentation

## 2019-09-14 LAB — URINALYSIS, ROUTINE W REFLEX MICROSCOPIC
Bilirubin Urine: NEGATIVE
Glucose, UA: NEGATIVE mg/dL
Ketones, ur: 20 mg/dL — AB
Leukocytes,Ua: NEGATIVE
Nitrite: NEGATIVE
Protein, ur: NEGATIVE mg/dL
Specific Gravity, Urine: 1.017 (ref 1.005–1.030)
pH: 6 (ref 5.0–8.0)

## 2019-09-14 LAB — CBC
HCT: 33.8 % — ABNORMAL LOW (ref 36.0–46.0)
Hemoglobin: 10.9 g/dL — ABNORMAL LOW (ref 12.0–15.0)
MCH: 28.5 pg (ref 26.0–34.0)
MCHC: 32.2 g/dL (ref 30.0–36.0)
MCV: 88.3 fL (ref 80.0–100.0)
Platelets: 200 10*3/uL (ref 150–400)
RBC: 3.83 MIL/uL — ABNORMAL LOW (ref 3.87–5.11)
RDW: 14.6 % (ref 11.5–15.5)
WBC: 11.6 10*3/uL — ABNORMAL HIGH (ref 4.0–10.5)
nRBC: 0 % (ref 0.0–0.2)

## 2019-09-14 LAB — COMPREHENSIVE METABOLIC PANEL
ALT: 11 U/L (ref 0–44)
AST: 15 U/L (ref 15–41)
Albumin: 5 g/dL (ref 3.5–5.0)
Alkaline Phosphatase: 44 U/L (ref 38–126)
Anion gap: 12 (ref 5–15)
BUN: 11 mg/dL (ref 6–20)
CO2: 24 mmol/L (ref 22–32)
Calcium: 9.1 mg/dL (ref 8.9–10.3)
Chloride: 101 mmol/L (ref 98–111)
Creatinine, Ser: 0.88 mg/dL (ref 0.44–1.00)
GFR calc Af Amer: >60 mL/min (ref >60)
GFR calc non Af Amer: >60 mL/min (ref >60)
Glucose, Bld: 112 mg/dL — ABNORMAL HIGH (ref 70–99)
Potassium: 3.2 mmol/L — ABNORMAL LOW (ref 3.5–5.1)
Sodium: 137 mmol/L (ref 135–145)
Total Bilirubin: 0.7 mg/dL (ref 0.3–1.2)
Total Protein: 8.3 g/dL — ABNORMAL HIGH (ref 6.5–8.1)

## 2019-09-14 LAB — LIPASE, BLOOD: Lipase: 25 U/L (ref 11–51)

## 2019-09-14 LAB — I-STAT BETA HCG BLOOD, ED (MC, WL, AP ONLY): I-stat hCG, quantitative: <5 m[IU]/mL (ref <5)

## 2019-09-14 MED ORDER — ONDANSETRON 4 MG PO TBDP
4.0000 mg | ORAL_TABLET | Freq: Once | ORAL | Status: AC | PRN
  Administered 2019-09-14: 4 mg via ORAL
  Filled 2019-09-14: qty 1

## 2019-09-14 MED ORDER — SODIUM CHLORIDE 0.9 % IV BOLUS: 1000.0000 mL | Freq: Once | INTRAVENOUS | Status: DC

## 2019-09-14 NOTE — ED Notes (Signed)
Signature pad malfunction, patient unable to sign.

## 2019-09-14 NOTE — ED Provider Notes (Signed)
[facility]-EMERGENCY DEPT Provider Note   CSN: [id number] Arrival date & time: 09/14/19  1326     History Chief Complaint  Patient presents with  . Abdominal Pain    Vickie Hayes is a 34 y.o. female.  Patient is a 34 year old female with past medical history significant for anemia presenting with left lower quadrant abdominal pain.  Patient was recently evaluated on June 24 for ureteral stone.  Patient states that around 9 AM this morning she was her normal self and then all of a sudden around 11 AM she got a sharp radiating left-sided pain that seem to move from her left lower quadrant up her left side.  She states that this pain would come and go and when it would occur she would curl up in a ball on the floor.  She states that a few hours after this pain started she started having vomiting is thrown up approximately 10 times, nonbloody, nonbilious.  She denies fevers, dysuria, urinary frequency but states she has had a bit of lightheadedness after the vomiting.  Patient was also recently prescribed medication for trichomonas found during previous appointment in mid June.  She states she did finish the course of medication.  Patient states she has had two CTs recently and request trying to avoid sitting as much as possible to reduce long-term malignancy risk.       Past Medical History:  Diagnosis Date  . Bacterial vaginosis   . Gestational diabetes    pt denies  . History of Papanicolaou smear of cervix 03/23/2013;[id number]   NEG; NEG  . Hypertension   . Kidney stone   . Medical history non-contributory   . Oligomenorrhea    OFF BC    Patient Active Problem List   Diagnosis Date Noted  . Pelvic pressure in female 08/15/2019  . Gestational hypertension w/o significant proteinuria in 3rd trimester 03/31/2017  . Chronic hypertension affecting pregnancy 03/18/2017  . Supervision of high risk pregnancy in third trimester 09/16/2016  . Anemia 07/05/2007     Past Surgical History:  Procedure Laterality Date  . EXTRACORPOREAL SHOCK WAVE LITHOTRIPSY Left 08/16/2019   Procedure: EXTRACORPOREAL SHOCK WAVE LITHOTRIPSY (ESWL);  Surgeon: Vanna Scotland, MD;  Location: [facility] ORS;  Service: Urology;  Laterality: Left;  . NEXPLANON INSERTION  04/23/2013  . NEXPLANON REMOVAL  10/2013   [facility] DREW  . TUBAL LIGATION Bilateral 04/01/2017   Procedure: POST PARTUM TUBAL LIGATION;  Surgeon: Ward, Elenora Fender, MD;  Location: [facility] ORS;  Service: Gynecology;  Laterality: Bilateral;  . WISDOM TOOTH EXTRACTION Bilateral      OB History    Gravida  3   Para  3   Term  2   Preterm  1   AB      Living  3     SAB      TAB      Ectopic      Multiple  1   Live Births  3           Family History  Problem Relation Age of Onset  . HIV Mother   . Tuberculosis Mother   . Hypertension Mother   . Cancer Maternal Grandmother   . Hypertension Maternal Grandmother   . Diabetes Maternal Grandmother     Social History   Tobacco Use  . Smoking status: Never Smoker  . Smokeless tobacco: Never Used  Vaping Use  . Vaping Use: Never used  Substance Use Topics  . Alcohol  use: No  . Drug use: No    Home Medications Prior to Admission medications   Medication Sig Start Date End Date Taking? Authorizing Provider  ibuprofen (ADVIL) 800 MG tablet Take 800 mg by mouth every 8 (eight) hours as needed for cramping.   Yes [provider]    Allergies    Other  Review of Systems   Review of Systems  Physical Exam Updated Vital Signs BP (!) 144/84   Pulse 70   Temp 98.6 F (37 C) (Oral)   Resp 20   Ht 5\' 3"  (1.6 m)   Wt 74.8 kg   LMP 08/29/2019   SpO2 99%   BMI 29.23 kg/m   Physical Exam  ED Results / Procedures / Treatments   Labs (all labs ordered are listed, but only abnormal results are displayed) Labs Reviewed  COMPREHENSIVE METABOLIC PANEL - Abnormal; Notable for the following components:      Result Value    Potassium 3.2 (*)    Glucose, Bld 112 (*)    Total Protein 8.3 (*)    All other components within normal limits  CBC - Abnormal; Notable for the following components:   WBC 11.6 (*)    RBC 3.83 (*)    Hemoglobin 10.9 (*)    HCT 33.8 (*)    All other components within normal limits  URINALYSIS, ROUTINE W REFLEX MICROSCOPIC - Abnormal; Notable for the following components:   Hgb urine dipstick SMALL (*)    Ketones, ur 20 (*)    Bacteria, UA RARE (*)    All other components within normal limits  LIPASE, BLOOD  I-STAT BETA HCG BLOOD, ED ([facility], [facility], [facility] ONLY)    EKG None  Radiology DG Abdomen 1 View  Result Date: 09/14/2019 CLINICAL DATA:  Left-sided abdominal pain. History of kidney stones. EXAM: ABDOMEN - 1 VIEW COMPARISON:  Abdominal radiograph 08/30/2019. CT 08/11/2019 FINDINGS: Previous left renal calculus is not definitively seen, stool overlies the urea of the left renal bed. No evidence of stone along the course of the ureter. Left pelvic calcification is unchanged from prior exam and consistent with phleboliths. Normal bowel gas pattern. Moderate stool in the transverse colon. No evidence of obstruction. No acute osseous abnormalities are seen. IMPRESSION: Previous left renal calculus is not definitively seen, overlying stool of the left renal bed partially obscures evaluation. No evidence of stone along the course of the ureter. Electronically Signed   By: 10/11/2019 M.D.   On: 09/14/2019 20:33   11/15/2019 Renal  Result Date: 09/14/2019 CLINICAL DATA:  Left abdominal pain, history of urolithiasis EXAM: RENAL / URINARY TRACT ULTRASOUND COMPLETE COMPARISON:  CT 08/11/2019, radiograph 09/14/2019 FINDINGS: Right Kidney: Renal measurements: 12.2 x 4.8 x 6.3 cm = volume: 193.2 mL. Mildly increased renal cortical echogenicity. No shadowing calculus, hydronephrosis or concerning renal mass. Left Kidney: Renal measurements: 10.7 x 5.4 x 6.5 cm = volume: 198.4 mL. Mildly increased renal cortical  echogenicity. 8 mm calculus is seen in the lower pole left kidney (image 23). No other shadowing calculi. No hydronephrosis. No concerning renal masses. Bladder: Voided prior to the exam. Mild wall thickening may therefore be related to underdistention. Other: None. IMPRESSION: 8 mm calculus present in the lower pole left kidney. No obstructive dropped a 3 is evident. Mildly increased renal cortical echogenicity. Correlate for features of medical renal disease. Mild bladder wall thickening, possibly related to underdistention of the urinary bladder though could correlate with symptoms and urinalysis results. Electronically Signed  By: Kreg Shropshire M.D.   On: 09/14/2019 20:46    Procedures Procedures (including critical care time)  Medications Ordered in ED Medications  sodium chloride 0.9 % bolus 1,000 mL (has no administration in time range)  ondansetron (ZOFRAN-ODT) disintegrating tablet 4 mg (4 mg Oral Given 09/14/19 1338)    ED Course  I have reviewed the triage vital signs and the nursing notes.  Pertinent labs & imaging results that were available during my care of the patient were reviewed by me and considered in my medical decision making (see chart for details).  34 year old female with past medical history significant for recent visit for kidney stones presenting with sharp left lower quadrant pain which radiates and comes and goes consistent with passing ureteral stone.  Will check hCG, CBC, CMP, abdominal x-ray, renal ultrasound.  hCG negative.  CBC with anemia consistent with patient's baseline, mildly elevated white blood cell count 11.6.  CMP with potassium of 3.2, otherwise unremarkable.  Abdominal x-ray significant shows previous left renal calculus no longer seen, the overlying stool in the left renal bed partial excuse evaluation, no evidence of stone along the course of the ureter.  Urinalysis with small hemoglobin, 20 ketones, rare bacteria, negative nitrite, negative leukocyte  esterase.  Patient provided with 1 L normal saline bolus but she refused this states she was no longer nauseated and preferred to drink the fluids that she does not have to get stuck. Ultrasound showed an 8 mm calculus present in the lower pole of the left kidney with mildly increased renal cortical echogenicity.  Patient's pain had resolved and patient was having no further bouts of nausea medically stable at this point.  Patient was discharged with a plan to follow-up with her urologist in the next 1-2 weeks with return precautions provided.    MDM Rules/Calculators/A&P                          34 year old female with past medical history significant for recent visit for kidney stones present with sharp left lower quadrant pain which radiates comes and goes.  Patient request to avoid CT scan if at all possible we will get a chest x-ray and a renal ultrasound as patient has a known history of stones and this is the most likely diagnosis.  ECG negative for pregnancy, urinalysis with small hemoglobin, negative nitrite, negative leukocyte esterase, rare bacteria.  Abdominal x-ray does not show the previously seen left renal calculus, though some overlying stool in the left renal bed does partial screw the evaluation per the read.  No evidence of stone along the course of the ureter present in abdominal x-ray.  Ultrasound did show the 8 mm calculus present in lower pole of the left kidney.  Overall assessment patient with left lower quadrant pain is consistent with passing a fragment of the stone.  As patient symptoms had resolved, patient without further pain, and patient's nausea resolved it was discussed with patient that we believe that she was stable for discharge and patient agreed with this and plan to follow-up with her urologist.  Final Clinical Impression(s) / ED Diagnoses Final diagnoses:  Left lower quadrant abdominal pain    Rx / DC Orders ED Discharge Orders    None       Jackelyn Poling,  DO 09/14/19 2256    Gerhard Munch, MD 09/17/19 [phone]

## 2019-09-14 NOTE — ED Triage Notes (Signed)
Per EMS-states complaining of left lower abdominal pain that happened about 1 hour ago while at work-recently had kidney stones removed from right 2 weeks-vomiting-no dysuria

## 2019-09-14 NOTE — Discharge Instructions (Addendum)
You came to the emergency department due to complaints of left lower quadrant pain and concern for a kidney stone with a previous history of these.  In the emergency department we did a x-ray which did not show the stone, however we did do an ultrasound which showed the stone still up in the kidney.  Your symptoms improved during your time in the emergency department and we determined it was safe to discharge home.  I recommend that you follow-up with your urologist in the next week.  I also recommended you follow-up with your primary care doctor in the next 1-2 weeks.  Please return if you develop fevers, significant abdominal pain, or other concerning symptoms.

## 2019-09-15 LAB — CALCULI, WITH PHOTOGRAPH (CLINICAL LAB)
Calcium Oxalate Dihydrate: 30 %
Calcium Oxalate Monohydrate: 40 %
Hydroxyapatite: 30 %
Weight Calculi: 9 mg

## 2019-11-20 ENCOUNTER — Telehealth: Payer: Self-pay | Admitting: Oncology

## 2019-11-20 ENCOUNTER — Encounter: Payer: Self-pay | Admitting: Oncology

## 2019-11-20 NOTE — Telephone Encounter (Signed)
Called to Discuss with patient about Covid symptoms and the use of regeneron, a monoclonal antibody infusion for those with mild to moderate Covid symptoms and at a high risk of hospitalization.     Pt is qualified for this infusion at the [facility] infusion center due to co-morbid conditions and/or a member of an at-risk group.     Unable to reach pt. Left message to return call and mychart message.   Past Medical History:  Diagnosis Date  . Bacterial vaginosis   . Gestational diabetes    pt denies  . History of Papanicolaou smear of cervix 03/23/2013;10102017   NEG; NEG  . Hypertension   . Kidney stone   . Medical history non-contributory   . Oligomenorrhea    OFF BC     Mignon Pine, AGNP-C [phone] (Infusion Center Hotline)

## 2019-12-03 ENCOUNTER — Ambulatory Visit
Admission: RE | Admit: 2019-12-03 | Discharge: 2019-12-03 | Disposition: A | Discharge status: Home / Self Care | Payer: Self-pay | Source: Ambulatory Visit | Attending: Physician Assistant | Admitting: Physician Assistant

## 2019-12-03 ENCOUNTER — Emergency Department
Admission: EM | Admit: 2019-12-03 | Discharge: 2019-12-03 | Disposition: A | Discharge status: Home / Self Care | Payer: Self-pay | Attending: Emergency Medicine | Admitting: Emergency Medicine

## 2019-12-03 ENCOUNTER — Encounter: Payer: Self-pay | Admitting: Physician Assistant

## 2019-12-03 ENCOUNTER — Other Ambulatory Visit: Payer: Self-pay | Admitting: Physician Assistant

## 2019-12-03 ENCOUNTER — Other Ambulatory Visit: Payer: Self-pay | Admitting: *Deleted

## 2019-12-03 ENCOUNTER — Other Ambulatory Visit: Payer: Self-pay

## 2019-12-03 ENCOUNTER — Ambulatory Visit
Admission: RE | Admit: 2019-12-03 | Discharge: 2019-12-03 | Disposition: A | Discharge status: Home / Self Care | Payer: Self-pay | Attending: Physician Assistant | Admitting: Physician Assistant

## 2019-12-03 ENCOUNTER — Ambulatory Visit (INDEPENDENT_AMBULATORY_CARE_PROVIDER_SITE_OTHER): Payer: Self-pay | Admitting: Physician Assistant

## 2019-12-03 VITALS — BP 133/86 | HR 102 | Ht 63.0 in | Wt 170.0 lb

## 2019-12-03 DIAGNOSIS — Z87442 Personal history of urinary calculi: Secondary | ICD-10-CM

## 2019-12-03 DIAGNOSIS — N201 Calculus of ureter: Secondary | ICD-10-CM | POA: Insufficient documentation

## 2019-12-03 DIAGNOSIS — Z5321 Procedure and treatment not carried out due to patient leaving prior to being seen by health care provider: Secondary | ICD-10-CM | POA: Insufficient documentation

## 2019-12-03 DIAGNOSIS — R109 Unspecified abdominal pain: Secondary | ICD-10-CM | POA: Insufficient documentation

## 2019-12-03 LAB — URINALYSIS, COMPLETE (UACMP) WITH MICROSCOPIC
Bacteria, UA: NONE SEEN
Specific Gravity, Urine: 1.02 (ref 1.005–1.030)

## 2019-12-03 LAB — BASIC METABOLIC PANEL
Anion gap: 10 (ref 5–15)
BUN: 15 mg/dL (ref 6–20)
CO2: 23 mmol/L (ref 22–32)
Calcium: 9.1 mg/dL (ref 8.9–10.3)
Chloride: 104 mmol/L (ref 98–111)
Creatinine, Ser: 1.18 mg/dL — ABNORMAL HIGH (ref 0.44–1.00)
GFR calc Af Amer: >60 mL/min (ref >60)
GFR calc non Af Amer: >60 mL/min (ref >60)
Glucose, Bld: 124 mg/dL — ABNORMAL HIGH (ref 70–99)
Potassium: 4.1 mmol/L (ref 3.5–5.1)
Sodium: 137 mmol/L (ref 135–145)

## 2019-12-03 LAB — CBC
HCT: 32.5 % — ABNORMAL LOW (ref 36.0–46.0)
Hemoglobin: 11 g/dL — ABNORMAL LOW (ref 12.0–15.0)
MCH: 28.7 pg (ref 26.0–34.0)
MCHC: 33.8 g/dL (ref 30.0–36.0)
MCV: 84.9 fL (ref 80.0–100.0)
Platelets: 184 10*3/uL (ref 150–400)
RBC: 3.83 MIL/uL — ABNORMAL LOW (ref 3.87–5.11)
RDW: 15.3 % (ref 11.5–15.5)
WBC: 10.1 10*3/uL (ref 4.0–10.5)
nRBC: 0 % (ref 0.0–0.2)

## 2019-12-03 LAB — POCT PREGNANCY, URINE: Preg Test, Ur: NEGATIVE

## 2019-12-03 MED ORDER — TAMSULOSIN HCL 0.4 MG PO CAPS: 0.4000 mg | ORAL_CAPSULE | Freq: Every day | ORAL | 0 refills | Status: DC

## 2019-12-03 MED ORDER — ONDANSETRON HCL 4 MG PO TABS: 4.0000 mg | ORAL_TABLET | Freq: Three times a day (TID) | ORAL | 0 refills | Status: AC | PRN

## 2019-12-03 MED ORDER — OXYCODONE-ACETAMINOPHEN 5-325 MG PO TABS: 1.0000 | ORAL_TABLET | Freq: Four times a day (QID) | ORAL | 0 refills | Status: AC | PRN

## 2019-12-03 NOTE — Patient Instructions (Signed)
START tamsulosin daily, ondansetron as needed for nausea/vomiting, and oxycodone-acetaminophen as needed for pain.  If your pain is not severe enough that you need narcotic pain medications, you may take Tylenol (acetaminophen). Do not take more than the maximum dose of pain medication (1-2 oxycodone or 1000mg  Tylenol) every 6 hours.

## 2019-12-03 NOTE — Progress Notes (Signed)
Faxed [phone]

## 2019-12-03 NOTE — Progress Notes (Signed)
12/03/2019 1:12 PM   Vickie Hayes [birth year] [id number]  CC: Chief Complaint  Patient presents with  . Nephrolithiasis    HPI: Vickie Hayes is a 34 y.o. female with PMH nephrolithiasis who recently underwent a successful ESWL for management of a 6 mm distal right ureteral stone with known remaining bilateral nonobstructing renal stones who presents today for evaluation of possible acute stone episode.  Today she reports a 1 day history of urethral and bladder pain, left flank pain radiating to the groin, nausea, and vomiting.  She describes the pain is intermittent in character and consistent with past stone episodes.  She originally presented to the ED this morning for evaluation of the same.  Labs notable for UA with no nitrites, pyuria, microscopic hematuria, or bacteriuria.  Creatinine elevated at 1.18 over baseline 0.5-0.8.  WBC count WNL.  Urine pregnancy negative.  She reports taking ibuprofen 800 mg at home this morning for management of her pain, however she threw it up.  She has not taken any other medications for management of her symptoms.  KUB today reveals superior migration of her known left renal stone, likely to the left UPJ.  PMH: Past Medical History:  Diagnosis Date  . Bacterial vaginosis   . Gestational diabetes    pt denies  . History of Papanicolaou smear of cervix 03/23/2013;10102017   NEG; NEG  . Hypertension   . Kidney stone   . Medical history non-contributory   . Oligomenorrhea    OFF BC    Surgical History: Past Surgical History:  Procedure Laterality Date  . EXTRACORPOREAL SHOCK WAVE LITHOTRIPSY Left 08/16/2019   Procedure: EXTRACORPOREAL SHOCK WAVE LITHOTRIPSY (ESWL);  Surgeon: Hollice Espy, MD;  Location: [facility] ORS;  Service: Urology;  Laterality: Left;  . NEXPLANON INSERTION  04/23/2013  . [place] REMOVAL  10/2013   CHARLES DREW  . TUBAL LIGATION Bilateral 04/01/2017   Procedure: POST PARTUM TUBAL LIGATION;  Surgeon: Ward,  Honor Loh, MD;  Location: [facility] ORS;  Service: Gynecology;  Laterality: Bilateral;  . WISDOM TOOTH EXTRACTION Bilateral     Home Medications:  Allergies as of 12/03/2019      Reactions   Other Hives, Itching, Swelling   Mango fruit Lips swell Itchy throat      Medication List       Accurate as of December 03, 2019  1:12 PM. If you have any questions, ask your nurse or doctor.        ibuprofen 800 MG tablet Commonly known as: ADVIL Take 800 mg by mouth every 8 (eight) hours as needed for cramping.       Allergies:  Allergies  Allergen Reactions  . Other Hives, Itching and Swelling    Mango fruit  Lips swell Itchy throat    Family History: Family History  Problem Relation Age of Onset  . HIV Mother   . Tuberculosis Mother   . Hypertension Mother   . Cancer Maternal Grandmother   . Hypertension Maternal Grandmother   . Diabetes Maternal Grandmother     Social History:   reports that she has never smoked. She has never used smokeless tobacco. She reports that she does not drink alcohol and does not use drugs.  Physical Exam: BP 133/86   Pulse (!) 102   Ht 5' 3"  (1.6 m)   Wt 170 lb (77.1 kg)   LMP 11/07/2019 (Approximate)   BMI 30.11 kg/m   Constitutional:  Alert and oriented, uncomfortable appearing, nontoxic appearing HEENT: [place],  AT Cardiovascular: No clubbing, cyanosis, or edema Respiratory: Normal respiratory effort, no increased work of breathing Skin: No rashes, bruises or suspicious lesions Neurologic: Grossly intact, no focal deficits, moving all 4 extremities Psychiatric: Normal mood and affect  Laboratory Data: See labs from ED visit this morning.  Pertinent Imaging: KUB, 12/03/2019: Suspect migration of left renal stone to UPJ  I personally reviewed the images referenced above and note interval superior migration of the left renal stone consistent with positioning in the left UPJ.  Assessment & Plan:   1. Flank pain with history of  urolithiasis Symptoms and imaging consistent with acute stone episode associated with a 6 mm left UPJ stone.  I offered the patient ESWL this Thursday without further imaging versus CT stone study to confirm diagnosis, however I explained that if CT confirmed stone episode, she would remain a candidate for ESWL this Thursday.  Ultimately, in shared decision making we elected to defer CT and proceed with plans for ESWL this week.  Starting patient on Flomax, Zofran,and Percocet for MET and management of her symptoms.  Counseled her to refrain from NSAID use in advance of her planned procedure.  Booking sheet completed today.  Patient expressed understanding.  UA today benign, low concern for infection.  I faxed an order for add-on urine culture to the [facility] lab. - ondansetron (ZOFRAN) 4 MG tablet; Take 1 tablet (4 mg total) by mouth every 8 (eight) hours as needed for up to 14 days.  Dispense: 20 tablet; Refill: 0 - tamsulosin (FLOMAX) 0.4 MG CAPS capsule; Take 1 capsule (0.4 mg total) by mouth daily.  Dispense: 30 capsule; Refill: 0 - oxyCODONE-acetaminophen (PERCOCET/ROXICET) 5-325 MG tablet; Take 1-2 tablets by mouth every 6 (six) hours as needed for up to 5 days for severe pain.  Dispense: 12 tablet; Refill: 0   Return in about 3 days (around 12/06/2019) for ESWL.  Debroah Loop, PA-C  [facility] [address] [place], [place] [address] [phone]

## 2019-12-03 NOTE — ED Notes (Signed)
Pt reports that she called her urologist and has an appointment there.

## 2019-12-03 NOTE — H&P (View-Only) (Signed)
12/03/2019 1:12 PM   Nadalee N Cayer 10-23-1985 [id number]  CC: Chief Complaint  Patient presents with  . Nephrolithiasis    HPI: Vickie Hayes is a 34 y.o. female with PMH nephrolithiasis who recently underwent a successful ESWL for management of a 6 mm distal right ureteral stone with known remaining bilateral nonobstructing renal stones who presents today for evaluation of possible acute stone episode.  Today she reports a 1 day history of urethral and bladder pain, left flank pain radiating to the groin, nausea, and vomiting.  She describes the pain is intermittent in character and consistent with past stone episodes.  She originally presented to the ED this morning for evaluation of the same.  Labs notable for UA with no nitrites, pyuria, microscopic hematuria, or bacteriuria.  Creatinine elevated at 1.18 over baseline 0.5-0.8.  WBC count WNL.  Urine pregnancy negative.  She reports taking ibuprofen 800 mg at home this morning for management of her pain, however she threw it up.  She has not taken any other medications for management of her symptoms.  KUB today reveals superior migration of her known left renal stone, likely to the left UPJ.  PMH: Past Medical History:  Diagnosis Date  . Bacterial vaginosis   . Gestational diabetes    pt denies  . History of Papanicolaou smear of cervix 03/23/2013;10102017   NEG; NEG  . Hypertension   . Kidney stone   . Medical history non-contributory   . Oligomenorrhea    OFF BC    Surgical History: Past Surgical History:  Procedure Laterality Date  . EXTRACORPOREAL SHOCK WAVE LITHOTRIPSY Left 08/16/2019   Procedure: EXTRACORPOREAL SHOCK WAVE LITHOTRIPSY (ESWL);  Surgeon: Hollice Espy, MD;  Location: [facility] ORS;  Service: Urology;  Laterality: Left;  . NEXPLANON INSERTION  04/23/2013  . [place] REMOVAL  10/2013   CHARLES DREW  . TUBAL LIGATION Bilateral 04/01/2017   Procedure: POST PARTUM TUBAL LIGATION;  Surgeon: Ward,  Honor Loh, MD;  Location: [facility] ORS;  Service: Gynecology;  Laterality: Bilateral;  . WISDOM TOOTH EXTRACTION Bilateral     Home Medications:  Allergies as of 12/03/2019      Reactions   Other Hives, Itching, Swelling   Mango fruit Lips swell Itchy throat      Medication List       Accurate as of December 03, 2019  1:12 PM. If you have any questions, ask your nurse or doctor.        ibuprofen 800 MG tablet Commonly known as: ADVIL Take 800 mg by mouth every 8 (eight) hours as needed for cramping.       Allergies:  Allergies  Allergen Reactions  . Other Hives, Itching and Swelling    Mango fruit  Lips swell Itchy throat    Family History: Family History  Problem Relation Age of Onset  . HIV Mother   . Tuberculosis Mother   . Hypertension Mother   . Cancer Maternal Grandmother   . Hypertension Maternal Grandmother   . Diabetes Maternal Grandmother     Social History:   reports that she has never smoked. She has never used smokeless tobacco. She reports that she does not drink alcohol and does not use drugs.  Physical Exam: BP 133/86   Pulse (!) 102   Ht 5' 3"  (1.6 m)   Wt 170 lb (77.1 kg)   LMP 11/07/2019 (Approximate)   BMI 30.11 kg/m   Constitutional:  Alert and oriented, uncomfortable appearing, nontoxic appearing HEENT: [place],  AT Cardiovascular: No clubbing, cyanosis, or edema Respiratory: Normal respiratory effort, no increased work of breathing Skin: No rashes, bruises or suspicious lesions Neurologic: Grossly intact, no focal deficits, moving all 4 extremities Psychiatric: Normal mood and affect  Laboratory Data: See labs from ED visit this morning.  Pertinent Imaging: KUB, 12/03/2019: Suspect migration of left renal stone to UPJ  I personally reviewed the images referenced above and note interval superior migration of the left renal stone consistent with positioning in the left UPJ.  Assessment & Plan:   1. Flank pain with history of  urolithiasis Symptoms and imaging consistent with acute stone episode associated with a 6 mm left UPJ stone.  I offered the patient ESWL this Thursday without further imaging versus CT stone study to confirm diagnosis, however I explained that if CT confirmed stone episode, she would remain a candidate for ESWL this Thursday.  Ultimately, in shared decision making we elected to defer CT and proceed with plans for ESWL this week.  Starting patient on Flomax, Zofran,and Percocet for MET and management of her symptoms.  Counseled her to refrain from NSAID use in advance of her planned procedure.  Booking sheet completed today.  Patient expressed understanding.  UA today benign, low concern for infection.  I faxed an order for add-on urine culture to the [facility] lab. - ondansetron (ZOFRAN) 4 MG tablet; Take 1 tablet (4 mg total) by mouth every 8 (eight) hours as needed for up to 14 days.  Dispense: 20 tablet; Refill: 0 - tamsulosin (FLOMAX) 0.4 MG CAPS capsule; Take 1 capsule (0.4 mg total) by mouth daily.  Dispense: 30 capsule; Refill: 0 - oxyCODONE-acetaminophen (PERCOCET/ROXICET) 5-325 MG tablet; Take 1-2 tablets by mouth every 6 (six) hours as needed for up to 5 days for severe pain.  Dispense: 12 tablet; Refill: 0   Return in about 3 days (around 12/06/2019) for ESWL.  Debroah Loop, PA-C  [facility] [address] [place], [place] [address] [phone]

## 2019-12-03 NOTE — ED Triage Notes (Signed)
Patient c/o left flank pain beginning last night. Patient reports hx of kidney stones, reports this feels similar.

## 2019-12-04 LAB — URINE CULTURE: Culture: <10000 — AB

## 2019-12-05 ENCOUNTER — Other Ambulatory Visit: Payer: Self-pay | Admitting: Urology

## 2019-12-05 DIAGNOSIS — N135 Crossing vessel and stricture of ureter without hydronephrosis: Secondary | ICD-10-CM

## 2019-12-06 ENCOUNTER — Other Ambulatory Visit: Payer: Self-pay

## 2019-12-06 ENCOUNTER — Encounter: Payer: Self-pay | Admitting: Urology

## 2019-12-06 ENCOUNTER — Encounter
Admission: RE | Disposition: A | Discharge status: Home / Self Care | Payer: Self-pay | Source: Home / Self Care | Attending: Urology

## 2019-12-06 ENCOUNTER — Ambulatory Visit: Payer: Self-pay

## 2019-12-06 ENCOUNTER — Ambulatory Visit
Admission: RE | Admit: 2019-12-06 | Discharge: 2019-12-06 | Disposition: A | Discharge status: Home / Self Care | Payer: Self-pay | Attending: Urology | Admitting: Urology

## 2019-12-06 DIAGNOSIS — R109 Unspecified abdominal pain: Secondary | ICD-10-CM

## 2019-12-06 DIAGNOSIS — N135 Crossing vessel and stricture of ureter without hydronephrosis: Secondary | ICD-10-CM

## 2019-12-06 DIAGNOSIS — Z87442 Personal history of urinary calculi: Secondary | ICD-10-CM | POA: Insufficient documentation

## 2019-12-06 DIAGNOSIS — I10 Essential (primary) hypertension: Secondary | ICD-10-CM | POA: Insufficient documentation

## 2019-12-06 DIAGNOSIS — N201 Calculus of ureter: Secondary | ICD-10-CM | POA: Insufficient documentation

## 2019-12-06 HISTORY — PX: EXTRACORPOREAL SHOCK WAVE LITHOTRIPSY: SHX1557

## 2019-12-06 LAB — GLUCOSE, CAPILLARY: Glucose-Capillary: 84 mg/dL (ref 70–99)

## 2019-12-06 SURGERY — LITHOTRIPSY, ESWL
Anesthesia: Moderate Sedation | Laterality: Left

## 2019-12-06 MED ORDER — DIPHENHYDRAMINE HCL 25 MG PO CAPS
ORAL_CAPSULE | ORAL | Status: AC
  Administered 2019-12-06: 25 mg via ORAL
  Filled 2019-12-06: qty 1

## 2019-12-06 MED ORDER — SODIUM CHLORIDE 0.9 % IV SOLN: INTRAVENOUS | Status: DC

## 2019-12-06 MED ORDER — TAMSULOSIN HCL 0.4 MG PO CAPS: 0.4000 mg | ORAL_CAPSULE | Freq: Every day | ORAL | 0 refills | Status: AC

## 2019-12-06 MED ORDER — DIAZEPAM 5 MG PO TABS
ORAL_TABLET | ORAL | Status: AC
  Filled 2019-12-06: qty 2

## 2019-12-06 MED ORDER — ONDANSETRON HCL 4 MG/2ML IJ SOLN
4.0000 mg | Freq: Once | INTRAMUSCULAR | Status: AC | PRN
  Administered 2019-12-06: 4 mg via INTRAVENOUS

## 2019-12-06 MED ORDER — ONDANSETRON HCL 4 MG/2ML IJ SOLN
INTRAMUSCULAR | Status: AC
  Filled 2019-12-06: qty 2

## 2019-12-06 MED ORDER — DIPHENHYDRAMINE HCL 25 MG PO CAPS: 25.0000 mg | ORAL_CAPSULE | ORAL | Status: AC

## 2019-12-06 MED ORDER — DIAZEPAM 5 MG PO TABS
10.0000 mg | ORAL_TABLET | ORAL | Status: AC
  Administered 2019-12-06: 10 mg via ORAL

## 2019-12-06 MED ORDER — CIPROFLOXACIN HCL 500 MG PO TABS: 500.0000 mg | ORAL_TABLET | ORAL | Status: AC

## 2019-12-06 MED ORDER — OXYCODONE-ACETAMINOPHEN 5-325 MG PO TABS: 1.0000 | ORAL_TABLET | ORAL | 0 refills | Status: AC | PRN

## 2019-12-06 MED ORDER — CIPROFLOXACIN HCL 500 MG PO TABS
ORAL_TABLET | ORAL | Status: AC
  Administered 2019-12-06: 500 mg via ORAL
  Filled 2019-12-06: qty 1

## 2019-12-06 NOTE — Discharge Instructions (Signed)

## 2019-12-06 NOTE — Brief Op Note (Signed)
12/06/2019  8:31 AM  PATIENT:  Vickie Hayes  34 y.o. female  PRE-OPERATIVE DIAGNOSIS:  Left UPJ stone, 39mm  POST-OPERATIVE DIAGNOSIS:  Same  PROCEDURE:  Procedure(s): EXTRACORPOREAL SHOCK WAVE LITHOTRIPSY (ESWL) (Left)  SURGEON:  Surgeon(s) and Role:    * Sondra Come, MD - Primary  ANESTHESIA: Conscious Sedation  EBL:  None  Drains: None  Specimen: None  Findings:  1. Excellent smudging of stone on KUB at conclusion 2. Tolerated SWL well  DISPO: Flomax, pain meds PRN, RTC 2 weeks KUB  Legrand Rams, MD 12/06/2019

## 2019-12-06 NOTE — Interval H&P Note (Signed)
UROLOGY H&P UPDATE  Agree with prior H&P dated 9/27 by Hilton Sinclair, PA. Left renal colic and nausea, urine culture negative, KUB suggests migration of left 28mm lower pole stone to UPJ. Calcification in left lower pelvis likely phlebolith seen on prior CT.  CT was recommended to confirm left UPJ stone, but patient deferred.  Cardiac: RRR Lungs: CTA bilaterally  Laterality: Left Procedure: left SWL  Urine: culture negative 9/27  Informed consent obtained, we specifically discussed the risks of bleeding/hematoma, infection, post-operative pain, steinstrasse, need for additional procedures.  Sondra Come, MD 12/06/2019&P

## 2019-12-20 ENCOUNTER — Other Ambulatory Visit: Payer: Self-pay | Admitting: Physician Assistant

## 2019-12-20 DIAGNOSIS — R109 Unspecified abdominal pain: Secondary | ICD-10-CM

## 2019-12-20 DIAGNOSIS — Z87442 Personal history of urinary calculi: Secondary | ICD-10-CM

## 2019-12-21 ENCOUNTER — Ambulatory Visit (INDEPENDENT_AMBULATORY_CARE_PROVIDER_SITE_OTHER): Payer: Self-pay | Admitting: Physician Assistant

## 2019-12-21 ENCOUNTER — Ambulatory Visit
Admission: RE | Admit: 2019-12-21 | Discharge: 2019-12-21 | Disposition: A | Discharge status: Home / Self Care | Payer: Self-pay | Source: Ambulatory Visit | Attending: Physician Assistant | Admitting: Physician Assistant

## 2019-12-21 ENCOUNTER — Ambulatory Visit: Payer: Self-pay | Admitting: Physician Assistant

## 2019-12-21 ENCOUNTER — Other Ambulatory Visit: Payer: Self-pay

## 2019-12-21 ENCOUNTER — Encounter: Payer: Self-pay | Admitting: Physician Assistant

## 2019-12-21 VITALS — BP 142/86 | HR 86 | Ht 63.0 in | Wt 177.0 lb

## 2019-12-21 DIAGNOSIS — N200 Calculus of kidney: Secondary | ICD-10-CM

## 2019-12-21 DIAGNOSIS — R109 Unspecified abdominal pain: Secondary | ICD-10-CM | POA: Insufficient documentation

## 2019-12-21 DIAGNOSIS — Z87442 Personal history of urinary calculi: Secondary | ICD-10-CM | POA: Insufficient documentation

## 2019-12-21 LAB — URINALYSIS, COMPLETE
Bilirubin, UA: NEGATIVE
Glucose, UA: NEGATIVE
Ketones, UA: NEGATIVE
Leukocytes,UA: NEGATIVE
Nitrite, UA: NEGATIVE
Protein,UA: NEGATIVE
Specific Gravity, UA: 1.015 (ref 1.005–1.030)
Urobilinogen, Ur: 0.2 mg/dL (ref 0.2–1.0)
pH, UA: 6.5 (ref 5.0–7.5)

## 2019-12-21 LAB — MICROSCOPIC EXAMINATION

## 2019-12-21 NOTE — Progress Notes (Signed)
12/21/2019 12:37 PM   Vickie Hayes 26-Feb-1986 [id number]  CC: Chief Complaint  Patient presents with  . Nephrolithiasis   HPI: Vickie Hayes is a 34 y.o. female with PMH nephrolithiasis s/p ESWL with Dr. Richardo Hanks on 12/06/2019 for management of a likely 6 mm left UPJ stone who presents today for postop follow-up.  Operative findings significant for excellent smudging of the stone on KUB.  Today, patient reports passing multiple fragments at home.  She forgets to bring them with her today.  She has had resolution of her left flank pain since the procedure.  She denies persistent gross hematuria.  KUB today with interval clearance of the left UPJ stone.  Presumed residual 3 mm right lower pole renal stone has not been seen on KUB or renal ultrasound since 08/16/2019.  Additionally, stone fragments from her previous ESWL have resulted with 40% calcium oxalate monohydrate, 30% calcium oxalate dihydrate, and 30% hydroxyapatite.  She reports a high oxalate diet and drinking primarily dark sodas.  In-office UA today positive for trace intact blood; urine microscopy pan negative.  PMH: Past Medical History:  Diagnosis Date  . Bacterial vaginosis   . Gestational diabetes    pt denies  . History of Papanicolaou smear of cervix 03/23/2013;10102017   NEG; NEG  . Hypertension   . Kidney stone   . Medical history non-contributory   . Oligomenorrhea    OFF BC    Surgical History: Past Surgical History:  Procedure Laterality Date  . EXTRACORPOREAL SHOCK WAVE LITHOTRIPSY Left 08/16/2019   Procedure: EXTRACORPOREAL SHOCK WAVE LITHOTRIPSY (ESWL);  Surgeon: Vanna Scotland, MD;  Location: [facility] ORS;  Service: Urology;  Laterality: Left;  . EXTRACORPOREAL SHOCK WAVE LITHOTRIPSY Left 12/06/2019   Procedure: EXTRACORPOREAL SHOCK WAVE LITHOTRIPSY (ESWL);  Surgeon: Sondra Come, MD;  Location: [facility] ORS;  Service: Urology;  Laterality: Left;  . NEXPLANON INSERTION  04/23/2013  . NEXPLANON  REMOVAL  10/2013   CHARLES DREW  . TUBAL LIGATION Bilateral 04/01/2017   Procedure: POST PARTUM TUBAL LIGATION;  Surgeon: Ward, Elenora Fender, MD;  Location: [facility] ORS;  Service: Gynecology;  Laterality: Bilateral;  . WISDOM TOOTH EXTRACTION Bilateral     Home Medications:  Allergies as of 12/21/2019      Reactions   Other Hives, Itching, Swelling   Mango fruit Lips swell Itchy throat      Medication List       Accurate as of December 21, 2019 12:37 PM. If you have any questions, ask your nurse or doctor.        ibuprofen 800 MG tablet Commonly known as: ADVIL Take 800 mg by mouth every 8 (eight) hours as needed for cramping.   tamsulosin 0.4 MG Caps capsule Commonly known as: FLOMAX Take 1 capsule (0.4 mg total) by mouth daily.       Allergies:  Allergies  Allergen Reactions  . Other Hives, Itching and Swelling    Mango fruit  Lips swell Itchy throat    Family History: Family History  Problem Relation Age of Onset  . HIV Mother   . Tuberculosis Mother   . Hypertension Mother   . Cancer Maternal Grandmother   . Hypertension Maternal Grandmother   . Diabetes Maternal Grandmother     Social History:   reports that she has never smoked. She has never used smokeless tobacco. She reports that she does not drink alcohol and does not use drugs.  Physical Exam: BP (!) 142/86 (BP Location: Left Arm, Patient  Position: Sitting, Cuff Size: Normal)   Pulse 86   Ht 5\' 3"  (1.6 m)   Wt 177 lb (80.3 kg)   LMP 12/06/2019 Comment: tubal ligation  BMI 31.35 kg/m   Constitutional:  Alert and oriented, no acute distress, nontoxic appearing HEENT: [place], AT Cardiovascular: No clubbing, cyanosis, or edema Respiratory: Normal respiratory effort, no increased work of breathing Skin: No rashes, bruises or suspicious lesions Neurologic: Grossly intact, no focal deficits, moving all 4 extremities Psychiatric: Normal mood and affect  Laboratory Data: Results for orders placed or  performed in visit on 12/21/19  Microscopic Examination   Urine  Result Value Ref Range   WBC, UA 0-5 0 - 5 /hpf   RBC 0-2 0 - 2 /hpf   Epithelial Cells (non renal) 0-10 0 - 10 /hpf   Bacteria, UA Few None seen/Few  Urinalysis, Complete  Result Value Ref Range   Specific Gravity, UA 1.015 1.005 - 1.030   pH, UA 6.5 5.0 - 7.5   Color, UA Straw Yellow   Appearance Ur Cloudy (A) Clear   Leukocytes,UA Negative Negative   Protein,UA Negative Negative/Trace   Glucose, UA Negative Negative   Ketones, UA Negative Negative   RBC, UA Trace (A) Negative   Bilirubin, UA Negative Negative   Urobilinogen, Ur 0.2 0.2 - 1.0 mg/dL   Nitrite, UA Negative Negative   Microscopic Examination See below:    Pertinent Imaging: KUB, 12/21/2019: CLINICAL DATA:  History of left-sided kidney stones. Lithotripsy performed 2 weeks ago.  EXAM: ABDOMEN - 1 VIEW  COMPARISON:  Radiographs, 12/06/2019 and 12/03/2019. CT, 08/11/2019.  FINDINGS: Kidneys are partly obscured by overlying bowel gas and stool.  There is a density in the left pelvis stable from the prior radiographs consistent with a phlebolith. No evidence of a ureteral stone. No visualized intrarenal stone. Stone noted in the left kidney on the prior radiographs is no longer visualized.  Normal bowel gas pattern.  No skeletal abnormality.  IMPRESSION: 1. No acute findings. 2. No radiographic evidence of nephroureterolithiasis. Previously noted left intrarenal stone not visualized on the current exam.   Electronically Signed   By: 10/11/2019 M.D.   On: 12/22/2019 19:34  I personally reviewed the images referenced above and note interval clearance of the left sided stone.  No urolithiasis noted on KUB today.  Assessment & Plan:   1. Nephrolithiasis Successful ESWL per patient reports, KUB, and UA.  Patient to follow-up as needed.  I counseled the patient on general stone prevention techniques including increasing fluid  intake, decreasing intake of high oxalate-containing foods, increasing citric acid intake, maintaining moderate dietary calcium intake, and decreasing salt intake.  Counseled her to increase her fluid intake and switch to lemonade or clear sodas as an alternative to dark soda.  She expressed understanding.  Written and verbal guidance provided today. - Urinalysis, Complete   Return if symptoms worsen or fail to improve.  12/24/2019, PA-C  [facility] [address], [address] [place], [place] [phone]

## 2019-12-31 ENCOUNTER — Ambulatory Visit: Payer: Self-pay | Admitting: Physician Assistant

## 2020-01-18 ENCOUNTER — Ambulatory Visit: Payer: Self-pay | Admitting: Physician Assistant

## 2020-01-18 ENCOUNTER — Other Ambulatory Visit: Payer: Self-pay

## 2020-01-18 DIAGNOSIS — Z113 Encounter for screening for infections with a predominantly sexual mode of transmission: Secondary | ICD-10-CM

## 2020-01-18 DIAGNOSIS — Z299 Encounter for prophylactic measures, unspecified: Secondary | ICD-10-CM

## 2020-01-18 LAB — WET PREP FOR TRICH, YEAST, CLUE
Trichomonas Exam: NEGATIVE
Yeast Exam: NEGATIVE

## 2020-01-18 MED ORDER — CLOTRIMAZOLE 1 % VA CREA: 1.0000 | TOPICAL_CREAM | Freq: Every day | VAGINAL | 0 refills | Status: AC

## 2020-01-20 ENCOUNTER — Encounter: Payer: Self-pay | Admitting: Physician Assistant

## 2020-01-20 NOTE — Progress Notes (Signed)
[facility] Department STI clinic/screening visit  Subjective:  Vickie Hayes is a 34 y.o. female being seen today for an STI screening visit. The patient reports they do have symptoms.  Patient reports that they do not desire a pregnancy in the next year.   They reported they are not interested in discussing contraception today.  Patient's last menstrual period was 01/04/2020.   Patient has the following medical conditions:   Patient Active Problem List   Diagnosis Date Noted  . Pelvic pressure in female 08/15/2019  . Gestational hypertension w/o significant proteinuria in 3rd trimester 03/31/2017  . Chronic hypertension affecting pregnancy 03/18/2017  . Supervision of high risk pregnancy in third trimester 09/16/2016  . Anemia 07/05/2007    Chief Complaint  Patient presents with  . SEXUALLY TRANSMITTED DISEASE    screening    HPI  Patient reports that she has had some white discharge with streaks of blood since 01/17/2020.  Also, reports slight dysuria.  Denies other symptoms, chronic conditions, regular medicines.  Reports last HIV test was years ago and last pap was 1 year ago.  Reports using BTL as her BCM.   See flowsheet for further details and programmatic requirements.    The following portions of the patient's history were reviewed and updated as appropriate: allergies, current medications, past medical history, past social history, past surgical history and problem list.  Objective:  There were no vitals filed for this visit.  Physical Exam Constitutional:      General: She is not in acute distress.    Appearance: Normal appearance.  HENT:     Head: Normocephalic and atraumatic.     Comments: No nits,lice, or hair loss. No cervical, supraclavicular or axillary adenopathy.    Mouth/Throat:     Mouth: Mucous membranes are moist.     Pharynx: Oropharynx is clear. No oropharyngeal exudate or posterior oropharyngeal erythema.  Eyes:      Conjunctiva/sclera: Conjunctivae normal.  Pulmonary:     Effort: Pulmonary effort is normal.  Abdominal:     Palpations: Abdomen is soft. There is no mass.     Tenderness: There is no abdominal tenderness. There is no guarding or rebound.  Genitourinary:    General: Normal vulva.     Rectum: Normal.     Comments: External genitalia/pubic area without nits, lice, edema, erythema, lesions and inguinal adenopathy. Vagina with normal mucosa and small amount of clumpy pinkish  discharge. Cervix without visible lesions. Uterus firm, mobile, nt, no masses, no CMT, no adnexal tenderness or fullness. Musculoskeletal:     Cervical back: Neck supple. No tenderness.  Skin:    General: Skin is warm and dry.     Findings: No bruising, erythema, lesion or rash.  Neurological:     Mental Status: She is alert and oriented to person, place, and time.  Psychiatric:        Mood and Affect: Mood normal.        Behavior: Behavior normal.        Thought Content: Thought content normal.        Judgment: Judgment normal.      Assessment and Plan:  Vickie Hayes is a 34 y.o. female presenting to the [facility] Department for STI screening  1. Screening for STD (sexually transmitted disease) Patient into clinic with symptoms. Rec condoms with all sex. Await test results.  Counseled that RN will call if needs to RTC for treatment once results are back. - WET  PREP FOR TRICH, YEAST, CLUE - Chlamydia/Gonorrhea [place] [facility] - HIV [place] [facility] - Syphilis Serology, [place] [facility] - Gonococcus culture  2. Prophylactic measure Will treat to cover for yeast due to exam findings with Clotrimazole 1 % vaginal cream 1 app qhs for 7 days. No sex for 10 days. - clotrimazole (CLOTRIMAZOLE-7) 1 % vaginal cream; Place 1 Applicatorful vaginally at bedtime.  Dispense: 45 g; Refill: 0     No follow-ups on file.  No future appointments.  Matt Holmes, PA

## 2020-01-22 LAB — GONOCOCCUS CULTURE

## 2020-02-04 ENCOUNTER — Telehealth: Payer: Self-pay | Admitting: Physician Assistant

## 2020-02-04 NOTE — Telephone Encounter (Signed)
Patient called the office over the holiday weekend with complaint of possible stones.  She is experiencing upper left quadrant back pain that radiates to her abdomen and pain in urinary tract.  Patient was also experiencing nausea and vomiting.  She is now experiencing urethra pain.

## 2020-02-04 NOTE — Telephone Encounter (Signed)
Patient added to Sam's schedule for 02/05/20.

## 2020-02-05 ENCOUNTER — Encounter: Payer: Self-pay | Admitting: Physician Assistant

## 2020-02-05 ENCOUNTER — Other Ambulatory Visit: Payer: Self-pay

## 2020-02-05 ENCOUNTER — Ambulatory Visit (INDEPENDENT_AMBULATORY_CARE_PROVIDER_SITE_OTHER): Payer: Self-pay | Admitting: Physician Assistant

## 2020-02-05 VITALS — BP 150/90 | HR 98 | Ht 63.0 in | Wt 180.0 lb

## 2020-02-05 DIAGNOSIS — R109 Unspecified abdominal pain: Secondary | ICD-10-CM

## 2020-02-05 DIAGNOSIS — R300 Dysuria: Secondary | ICD-10-CM

## 2020-02-05 DIAGNOSIS — Z87442 Personal history of urinary calculi: Secondary | ICD-10-CM

## 2020-02-08 LAB — MICROSCOPIC EXAMINATION: Bacteria, UA: NONE SEEN

## 2020-02-08 LAB — URINALYSIS, COMPLETE
Bilirubin, UA: NEGATIVE
Glucose, UA: NEGATIVE
Ketones, UA: NEGATIVE
Leukocytes,UA: NEGATIVE
Nitrite, UA: NEGATIVE
Protein,UA: NEGATIVE
Specific Gravity, UA: 1.02 (ref 1.005–1.030)
Urobilinogen, Ur: 0.2 mg/dL (ref 0.2–1.0)
pH, UA: 7 (ref 5.0–7.5)

## 2020-02-09 LAB — CT NG M GENITALIUM NAA, URINE
Chlamydia trachomatis, NAA: NEGATIVE
Mycoplasma genitalium NAA: NEGATIVE
Neisseria gonorrhoeae, NAA: NEGATIVE

## 2020-02-11 NOTE — Progress Notes (Signed)
02/05/2020 1:37 PM   Vickie Hayes 18-Jul-1985 [id number]  CC: Chief Complaint  Patient presents with  . Flank Pain    HPI: Vickie Hayes is a 34 y.o. female with PMH nephrolithiasis s/p ESWL with Dr. Richardo Hanks on 12/06/2019 for management of likely 6 mm left UVJ stone who presents today for who presents today for evaluation of possible acute stone episode.  I saw her in clinic most recently on 12/21/2019.  KUB showed interval clearance of the left UPJ stone and UA was benign at that time.  Today she reports a 3-day history of left flank pain radiating to the LLQ, shooting/throbbing pain with urination, and urethral irritation.  She is seen some blood in her urine, however she is unsure if this is associated with menses.  She has had some vomiting.  She reports the pain was relieved with a hot bath but has not taken any medication at home for treatment of her symptoms.  She denies fever or chills.  In-office UA today positive for trace intact blood; urine microscopy pan negative.  PMH: Past Medical History:  Diagnosis Date  . Bacterial vaginosis   . Gestational diabetes    pt denies  . History of Papanicolaou smear of cervix 03/23/2013;[id number]   NEG; NEG  . Hypertension   . Kidney stone   . Medical history non-contributory   . Oligomenorrhea    OFF BC    Surgical History: Past Surgical History:  Procedure Laterality Date  . EXTRACORPOREAL SHOCK WAVE LITHOTRIPSY Left 08/16/2019   Procedure: EXTRACORPOREAL SHOCK WAVE LITHOTRIPSY (ESWL);  Surgeon: Vanna Scotland, MD;  Location: [facility];  Service: Urology;  Laterality: Left;  . EXTRACORPOREAL SHOCK WAVE LITHOTRIPSY Left 12/06/2019   Procedure: EXTRACORPOREAL SHOCK WAVE LITHOTRIPSY (ESWL);  Surgeon: Sondra Come, MD;  Location: [facility];  Service: Urology;  Laterality: Left;  . NEXPLANON INSERTION  04/23/2013  . NEXPLANON REMOVAL  10/2013   [facility]  . TUBAL LIGATION Bilateral 04/01/2017   Procedure: POST PARTUM  TUBAL LIGATION;  Surgeon: Ward, Elenora Fender, MD;  Location: [facility];  Service: Gynecology;  Laterality: Bilateral;  . WISDOM TOOTH EXTRACTION Bilateral     Home Medications:  Allergies as of 02/05/2020      Reactions   Other Hives, Itching, Swelling   Mango fruit Lips swell Itchy throat      Medication List       Accurate as of February 05, 2020 11:59 PM. If you have any questions, ask your nurse or doctor.        clotrimazole 1 % vaginal cream Commonly known as: Clotrimazole-7 Place 1 Applicatorful vaginally at bedtime.   ibuprofen 800 MG tablet Commonly known as: ADVIL Take 800 mg by mouth every 8 (eight) hours as needed for cramping.   metroNIDAZOLE 0.75 % vaginal gel Commonly known as: METROGEL Place vaginally.   tamsulosin 0.4 MG Caps capsule Commonly known as: FLOMAX Take 1 capsule (0.4 mg total) by mouth daily.       Allergies:  Allergies  Allergen Reactions  . Other Hives, Itching and Swelling    Mango fruit  Lips swell Itchy throat    Family History: Family History  Problem Relation Age of Onset  . HIV Mother   . Tuberculosis Mother   . Hypertension Mother   . Cancer Maternal Grandmother   . Hypertension Maternal Grandmother   . Diabetes Maternal Grandmother     Social History:   reports that she has never smoked. She has  never used smokeless tobacco. She reports current alcohol use. She reports that she does not use drugs.  Physical Exam: BP (!) 150/90 (BP Location: Left Arm, Patient Position: Sitting, Cuff Size: Normal)   Pulse 98   Ht 5\' 3"  (1.6 m)   Wt 180 lb (81.6 kg)   BMI 31.89 kg/m   Constitutional:  Alert and oriented, no acute distress, nontoxic appearing HEENT: [place], AT Cardiovascular: No clubbing, cyanosis, or edema Respiratory: Normal respiratory effort, no increased work of breathing Skin: No rashes, bruises or suspicious lesions Neurologic: Grossly intact, no focal deficits, moving all 4 extremities Psychiatric: Normal  mood and affect  Laboratory Data: Results for orders placed or performed in visit on 02/05/20  Microscopic Examination   Urine  Result Value Ref Range   WBC, UA 0-5 0 - 5 /hpf   RBC 0-2 0 - 2 /hpf   Epithelial Cells (non renal) 0-10 0 - 10 /hpf   Bacteria, UA None seen None seen/Few  Urinalysis, Complete  Result Value Ref Range   Specific Gravity, UA 1.020 1.005 - 1.030   pH, UA 7.0 5.0 - 7.5   Color, UA Yellow Yellow   Appearance Ur Cloudy (A) Clear   Leukocytes,UA Negative Negative   Protein,UA Negative Negative/Trace   Glucose, UA Negative Negative   Ketones, UA Negative Negative   RBC, UA Trace (A) Negative   Bilirubin, UA Negative Negative   Urobilinogen, Ur 0.2 0.2 - 1.0 mg/dL   Nitrite, UA Negative Negative   Microscopic Examination See below:   Ct Ng M genitalium NAA, Urine  Result Value Ref Range   Mycoplasma genitalium NAA Negative Negative   Chlamydia trachomatis, NAA Negative Negative   Neisseria gonorrhoeae, NAA Negative Negative   Assessment & Plan:   34 year old female with intermittent left flank and LLQ pain episodes, previously associated with a likely left UVJ stone, now s/p ESWL.  UA benign today.  Differential includes retained stone fragment versus possible TOA. 1. Dysuria UA benign today.  Will pursue STI testing to rule out other infectious sources of her pain. - Urinalysis, Complete - Ct Ng M genitalium NAA, Urine  2. Flank pain with history of urolithiasis Ordering repeat ultrasound today for evaluate for hydronephrosis consistent with retained fragment.  Ultimately, patient may require CT stone study for further evaluation given her intermittent symptoms following treatment with reassuring UAs and KUBs. - 32 RENAL; Future  Return for Will call with RUS results.  [place], PA-C  [facility] [address] [place], [place] [phone]

## 2020-02-14 ENCOUNTER — Other Ambulatory Visit: Payer: Self-pay

## 2020-02-14 ENCOUNTER — Ambulatory Visit
Admission: RE | Admit: 2020-02-14 | Discharge: 2020-02-14 | Disposition: A | Discharge status: Home / Self Care | Payer: Self-pay | Source: Ambulatory Visit | Attending: Physician Assistant | Admitting: Physician Assistant

## 2020-02-14 DIAGNOSIS — Z87442 Personal history of urinary calculi: Secondary | ICD-10-CM | POA: Insufficient documentation

## 2020-02-14 DIAGNOSIS — R109 Unspecified abdominal pain: Secondary | ICD-10-CM | POA: Insufficient documentation

## 2020-03-17 ENCOUNTER — Telehealth: Payer: Self-pay

## 2020-03-17 ENCOUNTER — Encounter: Payer: Self-pay | Admitting: Emergency Medicine

## 2020-03-17 ENCOUNTER — Other Ambulatory Visit: Payer: Self-pay

## 2020-03-17 ENCOUNTER — Emergency Department
Admission: EM | Admit: 2020-03-17 | Discharge: 2020-03-17 | Disposition: A | Discharge status: Home / Self Care | Payer: Self-pay | Attending: Emergency Medicine | Admitting: Emergency Medicine

## 2020-03-17 DIAGNOSIS — N3001 Acute cystitis with hematuria: Secondary | ICD-10-CM | POA: Insufficient documentation

## 2020-03-17 DIAGNOSIS — I10 Essential (primary) hypertension: Secondary | ICD-10-CM | POA: Insufficient documentation

## 2020-03-17 DIAGNOSIS — R300 Dysuria: Secondary | ICD-10-CM

## 2020-03-17 LAB — URINALYSIS, COMPLETE (UACMP) WITH MICROSCOPIC
Bilirubin Urine: NEGATIVE
Glucose, UA: NEGATIVE mg/dL
Ketones, ur: NEGATIVE mg/dL
Leukocytes,Ua: NEGATIVE
Nitrite: POSITIVE — AB
Protein, ur: >=300 mg/dL — AB
RBC / HPF: >50 RBC/hpf — ABNORMAL HIGH (ref 0–5)
Specific Gravity, Urine: 1.027 (ref 1.005–1.030)
pH: 5 (ref 5.0–8.0)

## 2020-03-17 LAB — CHLAMYDIA/NGC RT PCR (ARMC ONLY)
Chlamydia Tr: NOT DETECTED
N gonorrhoeae: NOT DETECTED

## 2020-03-17 MED ORDER — METRONIDAZOLE 500 MG PO TABS: 500.0000 mg | ORAL_TABLET | Freq: Two times a day (BID) | ORAL | 0 refills | Status: AC

## 2020-03-17 MED ORDER — FOSFOMYCIN TROMETHAMINE 3 G PO PACK
3.0000 g | PACK | Freq: Once | ORAL | Status: AC
  Administered 2020-03-17: 3 g via ORAL
  Filled 2020-03-17: qty 3

## 2020-03-17 MED ORDER — DOXYCYCLINE HYCLATE 100 MG PO CAPS: 100.0000 mg | ORAL_CAPSULE | Freq: Two times a day (BID) | ORAL | 0 refills | Status: AC

## 2020-03-17 MED ORDER — CEFTRIAXONE SODIUM 250 MG IJ SOLR
250.0000 mg | Freq: Once | INTRAMUSCULAR | Status: AC
  Administered 2020-03-17: 250 mg via INTRAMUSCULAR
  Filled 2020-03-17: qty 250

## 2020-03-17 NOTE — Telephone Encounter (Signed)
Renal ultrasound results previously shared via MyChart.  To reiterate, no evidence of hydronephrosis at that time.  I recommended CT stone study if she continued to experience discomfort.

## 2020-03-17 NOTE — Discharge Instructions (Addendum)
As we discussed, we will be treating you for possible UTI.  You have been given antibiotics for this year.  Will also treat you for possible pelvic infection.  I would recommend discussing possible initiation of birth control given the relation to your cycles with [facility].

## 2020-03-17 NOTE — ED Notes (Signed)
rx sent to pharm info provided all questions answered, follow up pcp

## 2020-03-17 NOTE — ED Provider Notes (Signed)
[facility] Emergency Department Vickie Hayes Note  ____________________________________________   Event Date/Time   First MD Initiated Contact with Patient 03/17/20 1357     (approximate)  I have reviewed the triage vital signs and the nursing notes.   HISTORY  Chief Complaint Dysuria    HPI Vickie Hayes is a 35 y.o. female with history of hypertension, kidney stones, here with lower pelvic pain.  The patient states that off and on for the last several months, she has had recurrent episodes of dysuria and suprapubic pain and discomfort, typically associated after menstrual cycles.  She has been seen by urology for this issue and had negative STD testing as well as unremarkable renal ultrasound.  She states that over the last several days, since her recent cycle, she has had recurrence of burning with urination, intermittent suprapubic pain, with occasional radiation to the left adnexa, as well as urinary frequency and urgency.  No fevers or chills.  Her vaginal bleeding is stopped.  She does note some intermittent dyspareunia off and on, but no persistent symptoms.  No vaginal discharge.  No other complaints.        Past Medical History:  Diagnosis Date  . Bacterial vaginosis   . Gestational diabetes    pt denies  . History of Papanicolaou smear of cervix 03/23/2013;[id number]   NEG; NEG  . Hypertension   . Kidney stone   . Medical history non-contributory   . Oligomenorrhea    OFF BC    Patient Active Problem List   Diagnosis Date Noted  . Pelvic pressure in female 08/15/2019  . Gestational hypertension w/o significant proteinuria in 3rd trimester 03/31/2017  . Chronic hypertension affecting pregnancy 03/18/2017  . Supervision of high risk pregnancy in third trimester 09/16/2016  . Anemia 07/05/2007    Past Surgical History:  Procedure Laterality Date  . EXTRACORPOREAL SHOCK WAVE LITHOTRIPSY Left 08/16/2019   Procedure: EXTRACORPOREAL SHOCK  WAVE LITHOTRIPSY (ESWL);  Surgeon: Vanna ScotlandBrandon, Ashley, MD;  Location: [facility];  Service: Urology;  Laterality: Left;  . EXTRACORPOREAL SHOCK WAVE LITHOTRIPSY Left 12/06/2019   Procedure: EXTRACORPOREAL SHOCK WAVE LITHOTRIPSY (ESWL);  Surgeon: Sondra ComeSninsky, Brian C, MD;  Location: [facility] OR[facility];  Service: Urology;  Laterality: Left;  . NEXPLANON INSERTION  04/23/2013  . NEXPLANON REMOVAL  10/2013   [facility]  . TUBAL LIGATION Bilateral 04/01/2017   Procedure: POST PARTUM TUBAL LIGATION;  Surgeon: Ward, Elenora Fenderhelsea C, MD;  Location: [facility] OR[facility];  Service: Gynecology;  Laterality: Bilateral;  . WISDOM TOOTH EXTRACTION Bilateral     Prior to Admission medications   Medication Sig Start Date End Date Taking? Authorizing Katori Wirsing  doxycycline (VIBRAMYCIN) 100 MG capsule Take 1 capsule (100 mg total) by mouth 2 (two) times daily for 10 days. 03/17/20 03/27/20 Yes Shaune PollackIsaacs, Cameron, MD  metroNIDAZOLE (FLAGYL) 500 MG tablet Take 1 tablet (500 mg total) by mouth 2 (two) times daily for 10 days. 03/17/20 03/27/20 Yes Shaune PollackIsaacs, Cameron, MD  clotrimazole (CLOTRIMAZOLE-7) 1 % vaginal cream Place 1 Applicatorful vaginally at bedtime. Patient not taking: Reported on 02/05/2020 01/18/20   Matt HolmesHampton, Carla J, PA  ibuprofen (ADVIL) 800 MG tablet Take 800 mg by mouth every 8 (eight) hours as needed for cramping. Patient not taking: Reported on 12/21/2019    Taryne Kiger, Historical, MD  metroNIDAZOLE (METROGEL) 0.75 % vaginal gel Place vaginally. Patient not taking: Reported on 02/05/2020 01/11/20   Anselm Aumiller, Historical, MD  tamsulosin (FLOMAX) 0.4 MG CAPS capsule Take 1 capsule (0.4 mg total) by mouth  daily. Patient not taking: Reported on 12/21/2019 12/06/19   Sondra Come, MD    Allergies Other  Family History  Problem Relation Age of Onset  . HIV Mother   . Tuberculosis Mother   . Hypertension Mother   . Cancer Maternal Grandmother   . Hypertension Maternal Grandmother   . Diabetes Maternal Grandmother     Social  History Social History   Tobacco Use  . Smoking status: Never Smoker  . Smokeless tobacco: Never Used  Vaping Use  . Vaping Use: Never used  Substance Use Topics  . Alcohol use: Yes    Comment: sometimes  . Drug use: No    Review of Systems  Review of Systems  Constitutional: Negative for chills and fever.  HENT: Negative for sore throat.   Respiratory: Negative for shortness of breath.   Cardiovascular: Negative for chest pain.  Gastrointestinal: Negative for abdominal pain.  Genitourinary: Positive for dysuria, frequency and pelvic pain. Negative for flank pain.  Musculoskeletal: Negative for neck pain.  Skin: Negative for rash and wound.  Allergic/Immunologic: Negative for immunocompromised state.  Neurological: Negative for weakness and numbness.  Hematological: Does not bruise/bleed easily.  All other systems reviewed and are negative.    ____________________________________________  PHYSICAL EXAM:      VITAL SIGNS: ED Triage Vitals  Enc Vitals Group     BP 03/17/20 1320 (!) 147/88     Pulse Rate 03/17/20 1320 94     Resp 03/17/20 1320 20     Temp 03/17/20 1320 98.3 F (36.8 C)     Temp Source 03/17/20 1320 Oral     SpO2 03/17/20 1320 100 %     Weight 03/17/20 1319 180 lb (81.6 kg)     Height 03/17/20 1319 5\' 3"  (1.6 m)     Head Circumference --      Peak Flow --      Pain Score 03/17/20 1319 7     Pain Loc --      Pain Edu? --      Excl. in GC? --      Physical Exam Vitals and nursing note reviewed.  Constitutional:      General: She is not in acute distress.    Appearance: She is well-developed and well-nourished.  HENT:     Head: Normocephalic and atraumatic.  Eyes:     Conjunctiva/sclera: Conjunctivae normal.  Cardiovascular:     Rate and Rhythm: Normal rate and regular rhythm.     Heart sounds: Normal heart sounds.  Pulmonary:     Effort: Pulmonary effort is normal. No respiratory distress.     Breath sounds: No wheezing.  Abdominal:      General: There is no distension.     Tenderness: There is abdominal tenderness in the suprapubic area.     Comments: Mild suprapubic pain.  No rebound or guarding.  No specific lower quadrant pain.  Slight left adnexal pain.  Musculoskeletal:        General: No edema.     Cervical back: Neck supple.  Skin:    General: Skin is warm.     Capillary Refill: Capillary refill takes less than 2 seconds.     Findings: No rash.  Neurological:     Mental Status: She is alert and oriented to person, place, and time.     Motor: No abnormal muscle tone.       ____________________________________________   LABS (all labs ordered are listed, but only abnormal results  are displayed)  Labs Reviewed  URINALYSIS, COMPLETE (UACMP) WITH MICROSCOPIC - Abnormal; Notable for the following components:      Result Value   Color, Urine AMBER (*)    APPearance HAZY (*)    Hgb urine dipstick LARGE (*)    Protein, ur >=300 (*)    Nitrite POSITIVE (*)    RBC / HPF >50 (*)    Bacteria, UA FEW (*)    All other components within normal limits  CHLAMYDIA/NGC RT PCR ([facility] ONLY)  URINE CULTURE    ____________________________________________  EKG:  ________________________________________  RADIOLOGY All imaging, including plain films, CT scans, and ultrasounds, independently reviewed by me, and interpretations confirmed via formal radiology reads.  ED MD interpretation:     Official radiology report(s): No results found.  ____________________________________________  PROCEDURES   Procedure(s) performed (including Critical Care):  Procedures  ____________________________________________  INITIAL IMPRESSION / MDM / ASSESSMENT AND PLAN / ED COURSE  As part of my medical decision making, I reviewed the following data within the electronic MEDICAL RECORD NUMBER Nursing notes reviewed and incorporated, Old chart reviewed, Notes from prior ED visits, and [place] Controlled Substance Database        *Vickie Hayes was evaluated in Emergency Department on 03/17/2020 for the symptoms described in the history of present illness. She was evaluated in the context of the global COVID-19 pandemic, which necessitated consideration that the patient might be at risk for infection with the SARS-CoV-2 virus that causes COVID-19. Institutional protocols and algorithms that pertain to the evaluation of patients at risk for COVID-19 are in a state of rapid change based on information released by regulatory bodies including the CDC and federal and state organizations. These policies and algorithms were followed during the patient's care in the ED.  Some ED evaluations and interventions may be delayed as a result of limited staffing during the pandemic.*     Medical Decision Making: 35 year old female here with suprapubic pain and dysuria.  Patient has positive nitrites and hematuria with few bacteria, concerning for UTI.  Differential includes interstitial cystitis given the recurrent nature of her symptoms.  Less likely PID and she recently had negative STD testing.  That being said, she does endorse occasional dyspareunia.  Discussed possibility of a subacute to chronic non-STD related PID, and patient would like to attempt empiric treatment.  Given recent unremarkable ultrasound as well as any concerning symptoms for severe PID or TOA, will hold on ultrasound at this time.  Will have her follow-up with a PCP regarding possible outpatient transvaginal ultrasound and consideration of possible initiation of birth control given relation to her cycles.  No flank pain or signs of pyelonephritis.  ____________________________________________  FINAL CLINICAL IMPRESSION(S) / ED DIAGNOSES  Final diagnoses:  Dysuria  Acute cystitis with hematuria     MEDICATIONS GIVEN DURING THIS VISIT:  Medications  cefTRIAXone (ROCEPHIN) injection 250 mg (250 mg Intramuscular Given 03/17/20 1426)  fosfomycin (MONUROL)  packet 3 g (3 g Oral Given 03/17/20 1432)     ED Discharge Orders         Ordered    doxycycline (VIBRAMYCIN) 100 MG capsule  2 times daily        03/17/20 1422    metroNIDAZOLE (FLAGYL) 500 MG tablet  2 times daily        03/17/20 1422           Note:  This document was prepared using Dragon voice recognition software and may include unintentional dictation errors.  Shaune Pollack, MD 03/17/20 [phone]

## 2020-03-17 NOTE — ED Triage Notes (Signed)
C?O urethra irritation and burning with urination.  States problem has been ongoing for several months and intermittent.  Starts after menstrual cycle.

## 2020-03-17 NOTE — Telephone Encounter (Signed)
Patient called and left a voicemail on triage line requesting renal u/s results, please adivse

## 2020-03-19 LAB — URINE CULTURE: Culture: NO GROWTH

## 2020-06-11 ENCOUNTER — Encounter: Payer: Self-pay | Admitting: Physician Assistant

## 2020-06-11 ENCOUNTER — Other Ambulatory Visit: Payer: Self-pay

## 2020-06-11 ENCOUNTER — Ambulatory Visit: Payer: Self-pay | Admitting: Physician Assistant

## 2020-06-11 DIAGNOSIS — Z113 Encounter for screening for infections with a predominantly sexual mode of transmission: Secondary | ICD-10-CM

## 2020-06-11 LAB — WET PREP FOR TRICH, YEAST, CLUE
Trichomonas Exam: NEGATIVE
Yeast Exam: NEGATIVE

## 2020-06-11 NOTE — Progress Notes (Addendum)
  [facility] Department STI clinic/screening visit  Subjective:  Lindsay RHEANNON CERNEY is a 35 y.o. female being seen today for an STI screening visit. The patient reports they do have symptoms.  Patient reports that they do not desire a pregnancy in the next year.   They reported they are not interested in discussing contraception today.  No LMP recorded.   Patient has the following medical conditions:   Patient Active Problem List   Diagnosis Date Noted   Pelvic pressure in female 08/15/2019   Gestational hypertension w/o significant proteinuria in 3rd trimester 03/31/2017   Chronic hypertension affecting pregnancy 03/18/2017   Supervision of high risk pregnancy in third trimester 09/16/2016   Anemia 07/05/2007    Chief Complaint  Patient presents with   SEXUALLY TRANSMITTED DISEASE    screening    HPI  Patient reports that she has had internal and external vaginal irritation since 06/05/2020. Denies chronic conditions and regular medicines.  Reports last HIV test was in 2021 and last pap was .  States LMP was 05/22/2020 and has had BTL as BCM.    See flowsheet for further details and programmatic requirements.    The following portions of the patient's history were reviewed and updated as appropriate: allergies, current medications, past medical history, past social history, past surgical history and problem list.  Objective:  There were no vitals filed for this visit.  Physical Exam Constitutional:      General: She is not in acute distress.    Appearance: Normal appearance.  HENT:     Head: Normocephalic and atraumatic.  Eyes:     Conjunctiva/sclera: Conjunctivae normal.  Pulmonary:     Effort: Pulmonary effort is normal.  Skin:    General: Skin is warm and dry.  Neurological:     Mental Status: She is alert and oriented to person, place, and time.  Psychiatric:        Mood and Affect: Mood normal.        Behavior: Behavior normal.        Thought  Content: Thought content normal.        Judgment: Judgment normal.     Assessment and Plan:  Aine FELISHIA WARTMAN is a 35 y.o. female presenting to the [facility] Department for STI screening  1. Screening for STD (sexually transmitted disease) Patient into clinic with symptoms. Patient declines pelvic exam by provider and opts to self collect samples.  Counseled how to collect for accurate results. Reviewed with patient that wet mount is normal and on treatment is indicated today. Counseled that she can use OTC antifungal cream for symptom relief while awaiting other results. Rec condoms with all sex. Await test results.  Counseled that RN will call if needs to RTC for treatment once results are back. - WET PREP FOR TRICH, YEAST, CLUE - Chlamydia/Gonorrhea [facility] - HIV [place] [facility] - Syphilis Serology, [facility]     No follow-ups on file.  No future appointments.  Matt Holmes, PA

## 2020-06-17 LAB — HM HIV SCREENING LAB: HM HIV Screening: NEGATIVE

## 2020-10-01 ENCOUNTER — Ambulatory Visit: Payer: Self-pay | Admitting: Family Medicine

## 2020-10-01 ENCOUNTER — Encounter: Payer: Self-pay | Admitting: Family Medicine

## 2020-10-01 ENCOUNTER — Other Ambulatory Visit: Payer: Self-pay

## 2020-10-01 DIAGNOSIS — Z113 Encounter for screening for infections with a predominantly sexual mode of transmission: Secondary | ICD-10-CM

## 2020-10-01 LAB — WET PREP FOR TRICH, YEAST, CLUE
Trichomonas Exam: NEGATIVE
Yeast Exam: NEGATIVE

## 2020-10-01 NOTE — Progress Notes (Signed)
[facility] Department STI clinic/screening visit  Subjective:  Srishti SUAN PYEATT is a 35 y.o. female being seen today for an STI screening visit. The patient reports they do not have symptoms.  Patient reports that they do desire a pregnancy in the next year.   They reported they are not interested in discussing contraception today.  No LMP recorded.   Patient has the following medical conditions:   Patient Active Problem List   Diagnosis Date Noted   Pelvic pressure in female 08/15/2019   Gestational hypertension w/o significant proteinuria in 3rd trimester 03/31/2017   Chronic hypertension affecting pregnancy 03/18/2017   Supervision of high risk pregnancy in third trimester 09/16/2016   Anemia 07/05/2007    Chief Complaint  Patient presents with   SEXUALLY TRANSMITTED DISEASE    Screening    HPI  Patient reports her for screening, has s/sx   Last HIV test per patient/review of record was 06/11/2020 Patient reports last pap was "a couple of years ago" .   See flowsheet for further details and programmatic requirements.    The following portions of the patient's history were reviewed and updated as appropriate: allergies, current medications, past medical history, past social history, past surgical history and problem list.  Objective:  There were no vitals filed for this visit.  Physical Exam Vitals and nursing note reviewed.  Constitutional:      Appearance: Normal appearance.  HENT:     Head: Normocephalic and atraumatic.     Mouth/Throat:     Mouth: Mucous membranes are moist.     Pharynx: Oropharynx is clear. No oropharyngeal exudate or posterior oropharyngeal erythema.  Pulmonary:     Effort: Pulmonary effort is normal.  Chest:  Breasts:    Right: No axillary adenopathy or supraclavicular adenopathy.     Left: No axillary adenopathy or supraclavicular adenopathy.  Abdominal:     General: Abdomen is flat.     Palpations: There is no mass.      Tenderness: There is no abdominal tenderness. There is no rebound.  Genitourinary:    General: Normal vulva.     Exam position: Lithotomy position.     Pubic Area: No rash or pubic lice.      Labia:        Right: No rash or lesion.        Left: No rash or lesion.      Vagina: Normal. No vaginal discharge, erythema, bleeding or lesions.     Cervix: No cervical motion tenderness, discharge, friability, lesion or erythema.     Uterus: Normal.      Adnexa: Right adnexa normal and left adnexa normal.     Rectum: Normal.     Comments: External genitalia without, lice, nits, erythema, edema , lesions or inguinal adenopathy. Vagina with normal mucosa and blood discharge and pH equals 4.  Cervix without visual lesions, uterus firm, mobile, non-tender, no masses, CMT adnexal fullness or tenderness.   Lymphadenopathy:     Head:     Right side of head: No preauricular or posterior auricular adenopathy.     Left side of head: No preauricular or posterior auricular adenopathy.     Cervical: No cervical adenopathy.     Upper Body:     Right upper body: No supraclavicular or axillary adenopathy.     Left upper body: No supraclavicular or axillary adenopathy.     Lower Body: No right inguinal adenopathy. No left inguinal adenopathy.  Skin:  General: Skin is warm and dry.     Findings: No rash.  Neurological:     Mental Status: She is alert and oriented to person, place, and time.     Assessment and Plan:  Kem EISLEY BARBER is a 35 y.o. female presenting to the [facility] Department for STI screening  1. Screening examination for venereal disease  - Chlamydia/Gonorrhea [place] [facility] - WET PREP FOR TRICH, YEAST, CLUE - Chlamydia/Gonorrhea Post Lab  Patient accepted all screenings including wet prep oral, vaginal CT/GC and declines bloodwork for HIV/RPR.  Patient meets criteria for HepB screening? No. Ordered? No - does not meet criteria  Patient meets criteria for HepC  screening? No. Ordered? No - does not meet criteria   Wet prep results neg    No Treatment needed  Discussed time line for State Lab results and that patient will be called with positive results and encouraged patient to call if she had not heard in 2 weeks.  Counseled to return or seek care for continued or worsening symptoms Recommended condom use with all sex  Patient is currently using Sterilization by Laparoscopy,    to prevent pregnancy.     Return for as needed.  No future appointments.  Wendi Snipes, FNP

## 2021-03-06 ENCOUNTER — Other Ambulatory Visit: Payer: Self-pay

## 2021-03-06 ENCOUNTER — Emergency Department
Admission: EM | Admit: 2021-03-06 | Discharge: 2021-03-06 | Disposition: A | Discharge status: Home / Self Care | Payer: Self-pay | Attending: Emergency Medicine | Admitting: Emergency Medicine

## 2021-03-06 ENCOUNTER — Encounter: Payer: Self-pay | Admitting: Intensive Care

## 2021-03-06 DIAGNOSIS — Z20822 Contact with and (suspected) exposure to covid-19: Secondary | ICD-10-CM | POA: Insufficient documentation

## 2021-03-06 DIAGNOSIS — J029 Acute pharyngitis, unspecified: Secondary | ICD-10-CM

## 2021-03-06 DIAGNOSIS — I10 Essential (primary) hypertension: Secondary | ICD-10-CM | POA: Insufficient documentation

## 2021-03-06 DIAGNOSIS — J020 Streptococcal pharyngitis: Secondary | ICD-10-CM | POA: Insufficient documentation

## 2021-03-06 LAB — RESP PANEL BY RT-PCR (FLU A&B, COVID) ARPGX2
Influenza A by PCR: NEGATIVE
Influenza B by PCR: NEGATIVE
SARS Coronavirus 2 by RT PCR: NEGATIVE

## 2021-03-06 LAB — GROUP A STREP BY PCR: Group A Strep by PCR: DETECTED — AB

## 2021-03-06 MED ORDER — ONDANSETRON HCL 4 MG PO TABS: 4.0000 mg | ORAL_TABLET | Freq: Three times a day (TID) | ORAL | 0 refills | Status: AC | PRN

## 2021-03-06 MED ORDER — ACETAMINOPHEN 325 MG PO TABS
650.0000 mg | ORAL_TABLET | Freq: Once | ORAL | Status: AC | PRN
  Administered 2021-03-06: 18:00:00 650 mg via ORAL
  Filled 2021-03-06: qty 2

## 2021-03-06 MED ORDER — ONDANSETRON 4 MG PO TBDP
4.0000 mg | ORAL_TABLET | Freq: Once | ORAL | Status: AC
  Administered 2021-03-06: 20:00:00 4 mg via ORAL
  Filled 2021-03-06: qty 1

## 2021-03-06 MED ORDER — AMOXICILLIN 875 MG PO TABS: 875.0000 mg | ORAL_TABLET | Freq: Two times a day (BID) | ORAL | 0 refills | Status: AC

## 2021-03-06 NOTE — ED Provider Notes (Signed)
Emergency Medicine Provider Triage Evaluation Note  [name] , a 35 y.o. female  was evaluated in triage.  Pt complains of body aches, vomiting, weakness, sore throat. Symptoms started yesterday..  Review of Systems  Positive: Sore throat, chills, body aches Negative: Fever  Physical Exam  There were no vitals taken for this visit. Gen:   Awake, no distress   Resp:  Normal effort  MSK:   Moves extremities without difficulty  Other:    Medical Decision Making  Medically screening exam initiated at 5:40 PM.  Appropriate orders placed.  Vickie Hayes was informed that the remainder of the evaluation will be completed by another provider, this initial triage assessment does not replace that evaluation, and the importance of remaining in the ED until their evaluation is complete.   Vickie Pester, FNP 03/06/21 1742    Minna Antis, MD 03/07/21 [phone]

## 2021-03-06 NOTE — ED Provider Notes (Signed)
[facility]-EMERGENCY DEPARTMENT  ____________________________________________  Time seen: Approximately 7:28 PM  I have reviewed the triage vital signs and the nursing notes.   HISTORY  Chief Complaint Influenza   Historian Patient     HPI Vickie Vickie is a 35 y.o. female presents to the emergency department with flulike symptoms that started yesterday.  Patient has had vomiting, headache, body aches and cough patient's daughter has had similar symptoms.  No chest pain, chest tightness or abdominal pain.   Past Medical History:  Diagnosis Date   Bacterial vaginosis    Gestational diabetes    pt denies   History of Papanicolaou smear of cervix 03/23/2013;10102017   NEG; NEG   Hypertension    Kidney stone    Medical history non-contributory    Oligomenorrhea    OFF BC     Immunizations up to date:  Yes.     Past Medical History:  Diagnosis Date   Bacterial vaginosis    Gestational diabetes    pt denies   History of Papanicolaou smear of cervix 03/23/2013;10102017   NEG; NEG   Hypertension    Kidney stone    Medical history non-contributory    Oligomenorrhea    OFF BC    Patient Active Problem List   Diagnosis Date Noted   Pelvic pressure in female 08/15/2019   Gestational hypertension w/o significant proteinuria in 3rd trimester 03/31/2017   Chronic hypertension affecting pregnancy 03/18/2017   Supervision of high risk pregnancy in third trimester 09/16/2016   Anemia 07/05/2007    Past Surgical History:  Procedure Laterality Date   EXTRACORPOREAL SHOCK WAVE LITHOTRIPSY Left 08/16/2019   Procedure: EXTRACORPOREAL SHOCK WAVE LITHOTRIPSY (ESWL);  Surgeon: Hollice Espy, MD;  Location: [facility] ORS;  Service: Urology;  Laterality: Left;   EXTRACORPOREAL SHOCK WAVE LITHOTRIPSY Left 12/06/2019   Procedure: EXTRACORPOREAL SHOCK WAVE LITHOTRIPSY (ESWL);  Surgeon: Billey Co, MD;  Location: [facility] ORS;  Service: Urology;  Laterality: Left;   NEXPLANON  INSERTION  04/23/2013   NEXPLANON REMOVAL  10/2013   CHARLES DREW   TUBAL LIGATION Bilateral 04/01/2017   Procedure: POST PARTUM TUBAL LIGATION;  Surgeon: Ward, Honor Loh, MD;  Location: [facility] ORS;  Service: Gynecology;  Laterality: Bilateral;   WISDOM TOOTH EXTRACTION Bilateral     Prior to Admission medications   Medication Sig Start Date End Date Taking? Authorizing Provider  ondansetron (ZOFRAN) 4 MG tablet Take 1 tablet (4 mg total) by mouth every 8 (eight) hours as needed for up to 5 days for nausea or vomiting. 03/06/21 03/11/21 Yes Vallarie Mare M, PA-C  clotrimazole (CLOTRIMAZOLE-7) 1 % vaginal cream Place 1 Applicatorful vaginally at bedtime. Patient not taking: No sig reported 01/18/20   Jerene Dilling, PA  ibuprofen (ADVIL) 800 MG tablet Take 800 mg by mouth every 8 (eight) hours as needed for cramping. Patient not taking: No sig reported    [provider]  metroNIDAZOLE (METROGEL) 0.75 % vaginal gel Place vaginally. Patient not taking: No sig reported 01/11/20   [provider]  NUVARING 0.12-0.015 MG/24HR vaginal ring Insert 1 ring into vagina once a month  leave in place x 3 weeks, then remove x 1 week. Insert a new ring 7 days later. Patient not taking: Reported on 10/01/2020 03/18/20   [provider]  tamsulosin (FLOMAX) 0.4 MG CAPS capsule Take 1 capsule (0.4 mg total) by mouth daily. Patient not taking: No sig reported 12/06/19   Billey Co, MD    Allergies Other  Family History  Problem Relation Age of Onset   HIV Mother    Tuberculosis Mother    Hypertension Mother    Cancer Maternal Grandmother    Hypertension Maternal Grandmother    Diabetes Maternal Grandmother     Social History Social History   Tobacco Use   Smoking status: Never   Smokeless tobacco: Never  Vaping Use   Vaping Use: Never used  Substance Use Topics   Alcohol use: Yes    Comment: socially   Drug use: No      Review of Systems  Constitutional:  Patient has fever.  Eyes: No visual changes. No discharge ENT: Patient has congestion.  Cardiovascular: no chest pain. Respiratory: Patient has cough.  Gastrointestinal: No abdominal pain.  No nausea, no vomiting. Patient had diarrhea.  Genitourinary: Negative for dysuria. No hematuria Musculoskeletal: Patient has myalgias.  Skin: Negative for rash, abrasions, lacerations, ecchymosis. Neurological: Patient has headache, no focal weakness or numbness.    ____________________________________________   PHYSICAL EXAM:  VITAL SIGNS: ED Triage Vitals  Enc Vitals Group     BP 03/06/21 1744 (!) 153/91     Pulse Rate 03/06/21 1744 (!) 125     Resp 03/06/21 1744 18     Temp 03/06/21 1744 (!) 103.1 F (39.5 C)     Temp Source 03/06/21 1744 Oral     SpO2 03/06/21 1744 100 %     Weight 03/06/21 1746 193 lb (87.5 kg)     Height 03/06/21 1746 5\' 3"  (1.6 m)     Head Circumference --      Peak Flow --      Pain Score 03/06/21 1746 10     Pain Loc --      Pain Edu? --      Excl. in GC? --      Constitutional: Alert and oriented. Patient is lying supine. Eyes: Conjunctivae are normal. PERRL. EOMI. Head: Atraumatic. ENT:      Ears: Tympanic membranes are mildly injected with mild effusion bilaterally.       Nose: No congestion/rhinnorhea.      Mouth/Throat: Mucous membranes are moist. Posterior pharynx is mildly erythematous.  Hematological/Lymphatic/Immunilogical: No cervical lymphadenopathy.  Cardiovascular: Normal rate, regular rhythm. Normal S1 and S2.  Good peripheral circulation. Respiratory: Normal respiratory effort without tachypnea or retractions. Lungs CTAB. Good air entry to the bases with no decreased or absent breath sounds. Gastrointestinal: Bowel sounds 4 quadrants. Soft and nontender to palpation. No guarding or rigidity. No palpable masses. No distention. No CVA tenderness. Musculoskeletal: Full range of motion to all extremities. No gross deformities  appreciated. Neurologic:  Normal speech and language. No gross focal neurologic deficits are appreciated.  Skin:  Skin is warm, dry and intact. No rash noted. Psychiatric: Mood and affect are normal. Speech and behavior are normal. Patient exhibits appropriate insight and judgement.  ____________________________________________   LABS (all labs ordered are listed, but only abnormal results are displayed)  Labs Reviewed  RESP PANEL BY RT-PCR (FLU A&B, COVID) ARPGX2  GROUP A STREP BY PCR   ____________________________________________  EKG   ____________________________________________  RADIOLOGY   No results found.  ____________________________________________    PROCEDURES  Procedure(s) performed:     Procedures     Medications  ondansetron (ZOFRAN-ODT) disintegrating tablet 4 mg (has no administration in time range)  acetaminophen (TYLENOL) tablet 650 mg (650 mg Oral Given 03/06/21 1752)     ____________________________________________   INITIAL IMPRESSION / ASSESSMENT AND PLAN / ED COURSE  Pertinent labs & imaging results that were available during my care of the patient were reviewed by me and considered in my medical decision making (see chart for details).      Assessment and Plan: Viral URI 35 year old female presents to the emergency department with fever, headache and body aches that started yesterday.  Patient was febrile and tachycardic at triage.  Fever and tachycardia trended down while waiting in the emergency department after antipyretics were given.  She tested negative for COVID and flu.  Group A strep in process at this time.   Group A strep testing was positive.  We will treat with amoxicillin twice daily for the next 10 days.  ____________________________________________  FINAL CLINICAL IMPRESSION(S) / ED DIAGNOSES  Final diagnoses:  Pharyngitis, unspecified etiology      NEW MEDICATIONS STARTED DURING THIS VISIT:  ED  Discharge Orders          Ordered    ondansetron (ZOFRAN) 4 MG tablet  Every 8 hours PRN        03/06/21 1907                This chart was dictated using voice recognition software/Dragon. Despite best efforts to proofread, errors can occur which can change the meaning. Any change was purely unintentional.     Lannie Fields, PA-C 03/06/21 2106    Naaman Plummer, MD 03/07/21 [phone]

## 2021-03-06 NOTE — ED Triage Notes (Signed)
Patient presents with flu like symptoms. C/o body aches, chills, and fatigue

## 2021-03-06 NOTE — Discharge Instructions (Addendum)
You can take Zofran every 8 hours for nausea and vomiting. You can take 650 mg of Tylenol alternating with 600 mg of ibuprofen every 4 hours. He tested negative for COVID and influenza. If your group A strep testing results are positive, you will receive a phone call from me.

## 2021-10-22 ENCOUNTER — Ambulatory Visit: Payer: Self-pay

## 2022-04-10 IMAGING — CT CT RENAL STONE PROTOCOL
3 of 4 series · 9 of 46 positions shown, 16 images · non-contrast
Comparison: April 28, 2019

CLINICAL DATA: Right flank pain.

EXAM:
CT ABDOMEN AND PELVIS WITHOUT CONTRAST
TECHNIQUE: Multidetector CT imaging of the abdomen and pelvis was performed
following the standard protocol without IV contrast.

[Series 4: lung bases · axial · 0.71mm/px · z∈[-136,-56]mm · 5 of 26 slices shown, 10 images]
[im 5/26  soft-tissue]
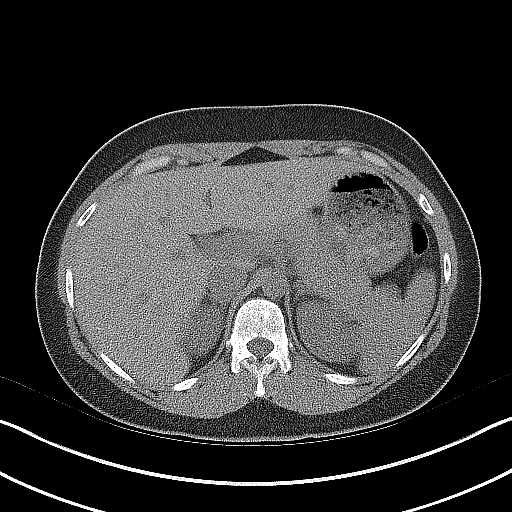
[im 5/26  bone]
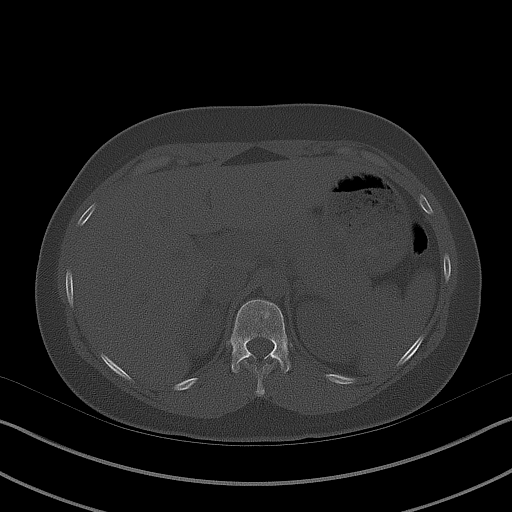
[im 9/26  soft-tissue]
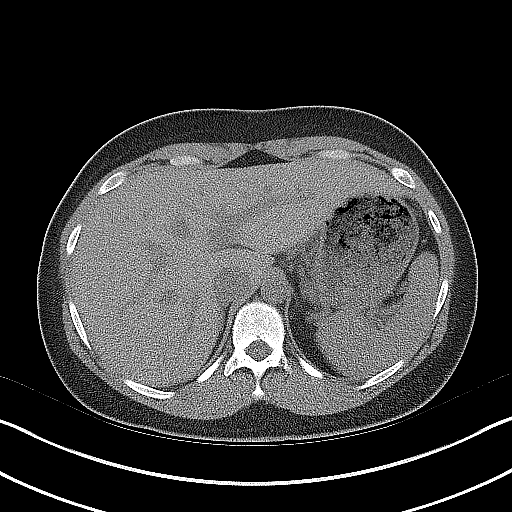
[im 9/26  lung]
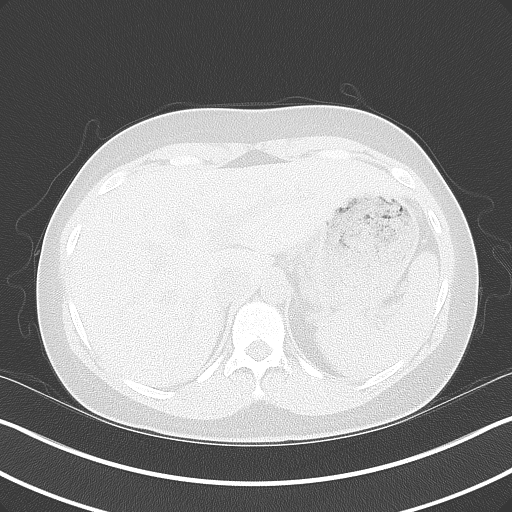
[im 13/26  soft-tissue]
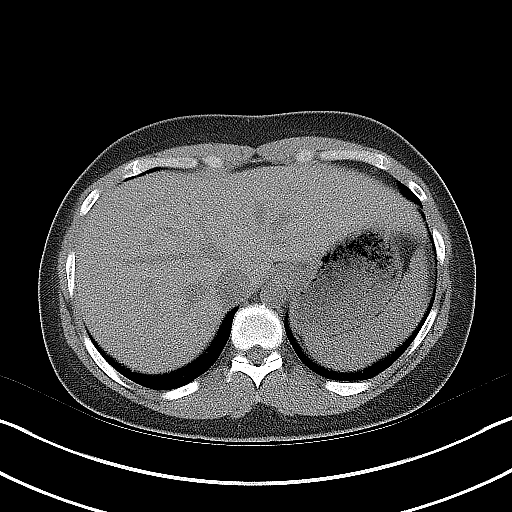
[im 13/26  lung]
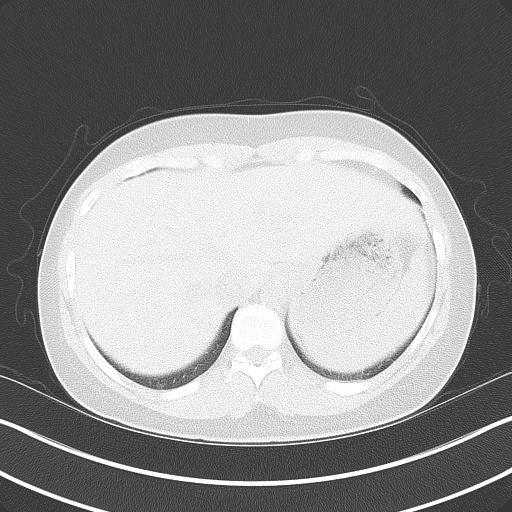
[im 17/26  soft-tissue]
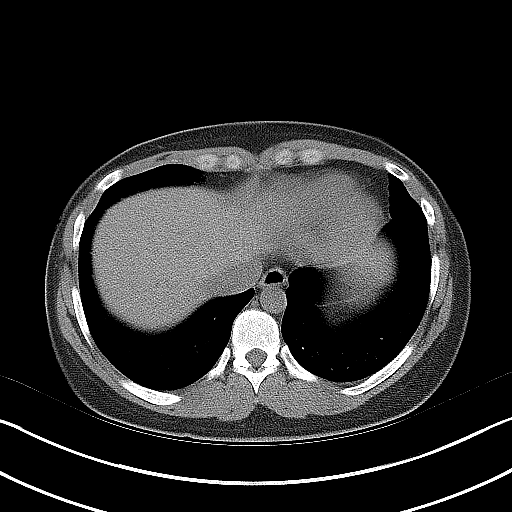
[im 17/26  lung]
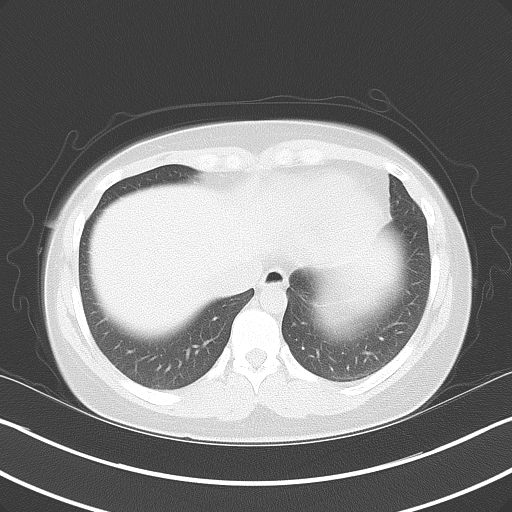
[im 21/26  soft-tissue]
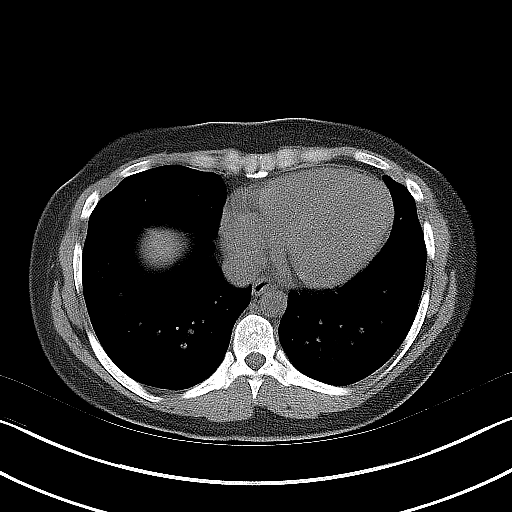
[im 21/26  lung]
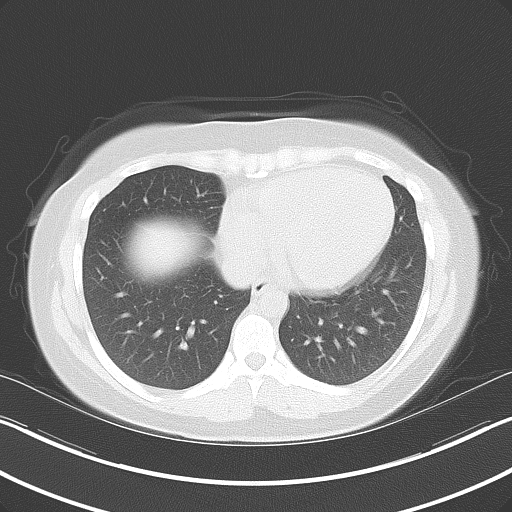

[Series 5: coronal · coronal · 0.79mm/px · 3 of 126 slices shown, 4 images]
[im 42/126  soft-tissue]
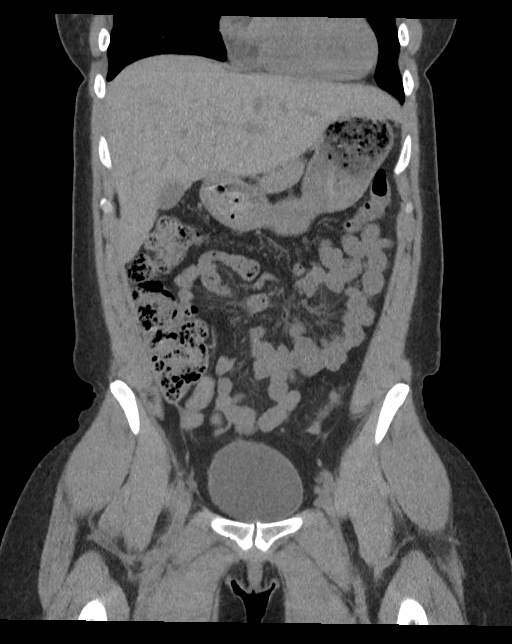
[im 56/126  soft-tissue]
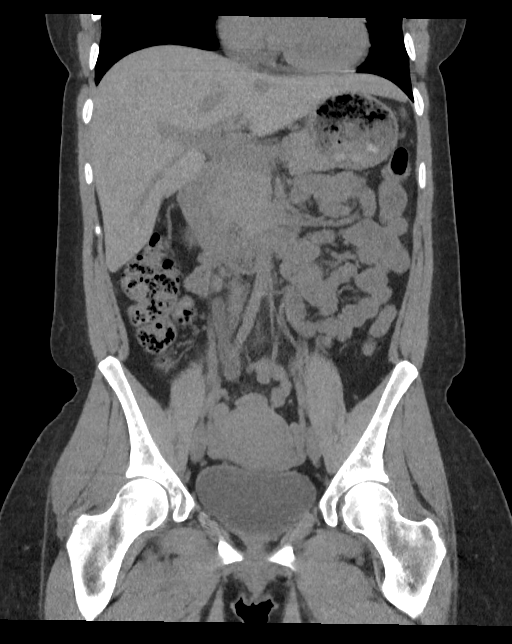
[im 56/126  bone]
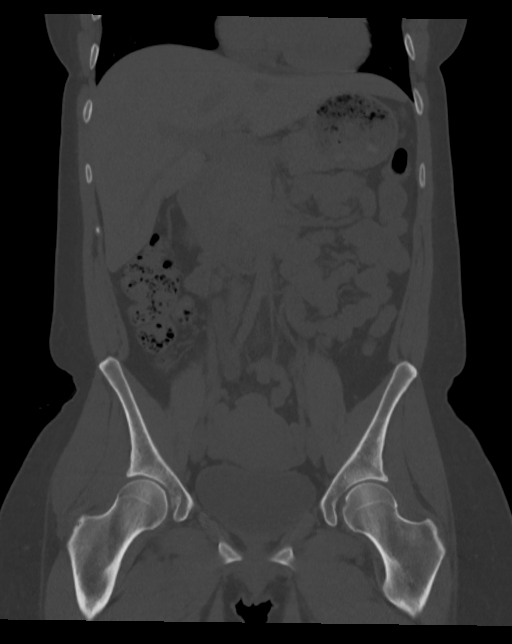
[im 70/126  soft-tissue]
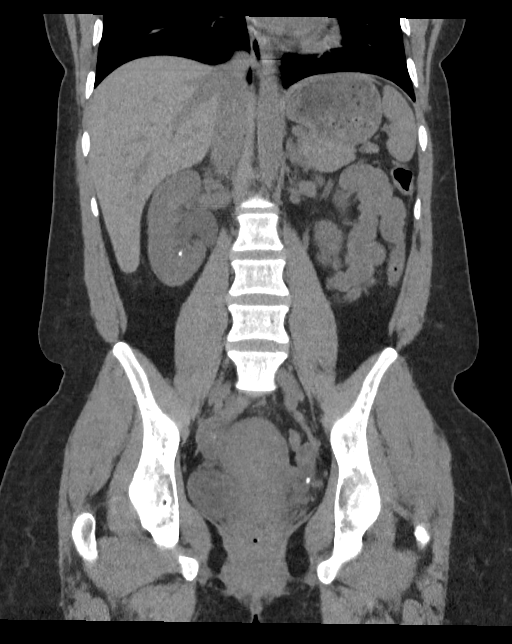

[Series 6: sagittal · sagittal · 0.56mm/px · 1 of 172 slices shown, 2 images]
[im 58/172  soft-tissue]
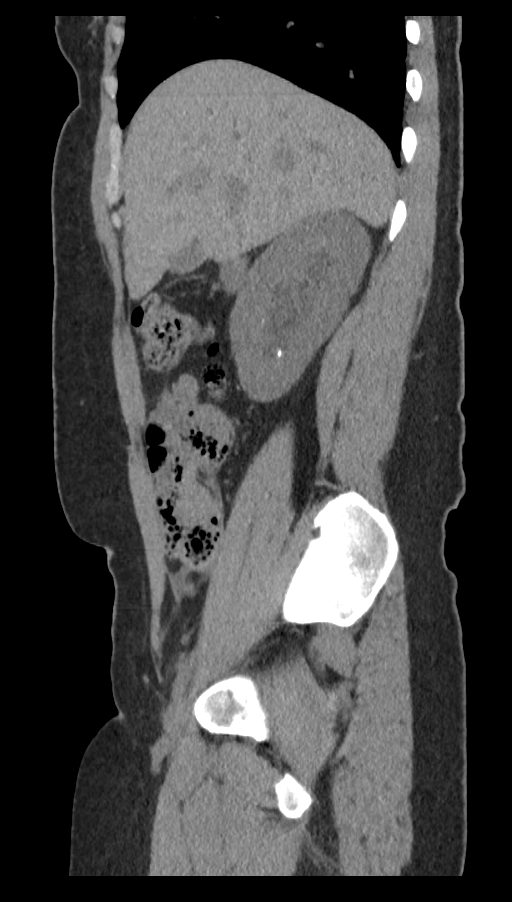
[im 58/172  bone]
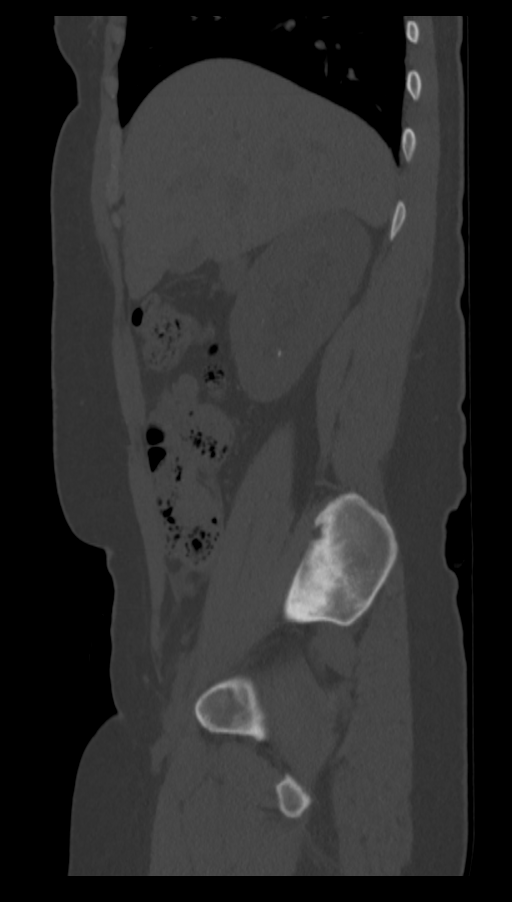

[9 of 46 positions shown; findings below may reference images not displayed]

FINDINGS: Lower chest: No acute abnormality.

Hepatobiliary: No focal liver abnormality is seen. No gallstones,
gallbladder wall thickening, or biliary dilatation.

Pancreas: Unremarkable. No pancreatic ductal dilatation or
surrounding inflammatory changes.

Spleen: Normal in size without focal abnormality.

Adrenals/Urinary Tract: Adrenal glands are unremarkable. Kidneys are
normal in size, without focal lesions. A 6 mm obstructing renal
stone is seen at the right UVJ with mild to moderate severity
right-sided hydronephrosis and hydroureter. A 3 mm nonobstructing
renal stone is seen within the mid to lower right kidney. A 6 mm
nonobstructing renal stone is seen within the mid to lower left
kidney. Bladder is unremarkable.

Stomach/Bowel: Stomach is within normal limits. Appendix appears
normal. No evidence of bowel wall thickening, distention, or
inflammatory changes.

Vascular/Lymphatic: No significant vascular findings are present. No
enlarged abdominal or pelvic lymph nodes.

Reproductive: Uterus and bilateral adnexa are unremarkable.

Other: No abdominal wall hernia or abnormality. No abdominopelvic
ascites.

Musculoskeletal: No acute or significant osseous findings.
IMPRESSION: 1. 6 mm obstructing renal stone at the right UVJ with mild to
moderate severity right-sided hydronephrosis and hydroureter.
2. Bilateral nonobstructing renal calculi.

## 2022-07-12 ENCOUNTER — Encounter: Payer: Self-pay | Admitting: Radiology

## 2022-07-12 ENCOUNTER — Emergency Department: Payer: BLUE CROSS/BLUE SHIELD

## 2022-07-12 ENCOUNTER — Emergency Department
Admission: EM | Admit: 2022-07-12 | Discharge: 2022-07-12 | Disposition: A | Discharge status: Home / Self Care | Payer: BLUE CROSS/BLUE SHIELD | Attending: Emergency Medicine | Admitting: Emergency Medicine

## 2022-07-12 ENCOUNTER — Other Ambulatory Visit: Payer: Self-pay

## 2022-07-12 DIAGNOSIS — M791 Myalgia, unspecified site: Secondary | ICD-10-CM | POA: Insufficient documentation

## 2022-07-12 DIAGNOSIS — I10 Essential (primary) hypertension: Secondary | ICD-10-CM | POA: Diagnosis not present

## 2022-07-12 DIAGNOSIS — Z20822 Contact with and (suspected) exposure to covid-19: Secondary | ICD-10-CM | POA: Diagnosis not present

## 2022-07-12 DIAGNOSIS — R509 Fever, unspecified: Secondary | ICD-10-CM

## 2022-07-12 DIAGNOSIS — R519 Headache, unspecified: Secondary | ICD-10-CM | POA: Diagnosis not present

## 2022-07-12 DIAGNOSIS — M545 Low back pain, unspecified: Secondary | ICD-10-CM | POA: Insufficient documentation

## 2022-07-12 LAB — CBC WITH DIFFERENTIAL/PLATELET
Abs Immature Granulocytes: 0.01 10*3/uL (ref 0.00–0.07)
Basophils Absolute: 0 10*3/uL (ref 0.0–0.1)
Basophils Relative: 0 %
Eosinophils Absolute: 0 10*3/uL (ref 0.0–0.5)
Eosinophils Relative: 0 %
HCT: 29.6 % — ABNORMAL LOW (ref 36.0–46.0)
Hemoglobin: 9.4 g/dL — ABNORMAL LOW (ref 12.0–15.0)
Immature Granulocytes: 0 %
Lymphocytes Relative: 7 %
Lymphs Abs: 0.3 10*3/uL — ABNORMAL LOW (ref 0.7–4.0)
MCH: 25.9 pg — ABNORMAL LOW (ref 26.0–34.0)
MCHC: 31.8 g/dL (ref 30.0–36.0)
MCV: 81.5 fL (ref 80.0–100.0)
Monocytes Absolute: 0.4 10*3/uL (ref 0.1–1.0)
Monocytes Relative: 10 %
Neutro Abs: 3.1 10*3/uL (ref 1.7–7.7)
Neutrophils Relative %: 83 %
Platelets: 165 10*3/uL (ref 150–400)
RBC: 3.63 MIL/uL — ABNORMAL LOW (ref 3.87–5.11)
RDW: 15.9 % — ABNORMAL HIGH (ref 11.5–15.5)
WBC: 3.8 10*3/uL — ABNORMAL LOW (ref 4.0–10.5)
nRBC: 0 % (ref 0.0–0.2)

## 2022-07-12 LAB — URINALYSIS, ROUTINE W REFLEX MICROSCOPIC
Bacteria, UA: NONE SEEN
Bilirubin Urine: NEGATIVE
Glucose, UA: NEGATIVE mg/dL
Ketones, ur: NEGATIVE mg/dL
Leukocytes,Ua: NEGATIVE
Nitrite: NEGATIVE
Protein, ur: NEGATIVE mg/dL
Specific Gravity, Urine: 1.001 — ABNORMAL LOW (ref 1.005–1.030)
pH: 6 (ref 5.0–8.0)

## 2022-07-12 LAB — COMPREHENSIVE METABOLIC PANEL
ALT: 13 U/L (ref 0–44)
AST: 18 U/L (ref 15–41)
Albumin: 4.3 g/dL (ref 3.5–5.0)
Alkaline Phosphatase: 44 U/L (ref 38–126)
Anion gap: 8 (ref 5–15)
BUN: 8 mg/dL (ref 6–20)
CO2: 24 mmol/L (ref 22–32)
Calcium: 9 mg/dL (ref 8.9–10.3)
Chloride: 104 mmol/L (ref 98–111)
Creatinine, Ser: 0.69 mg/dL (ref 0.44–1.00)
GFR, Estimated: >60 mL/min (ref >60)
Glucose, Bld: 104 mg/dL — ABNORMAL HIGH (ref 70–99)
Potassium: 3.3 mmol/L — ABNORMAL LOW (ref 3.5–5.1)
Sodium: 136 mmol/L (ref 135–145)
Total Bilirubin: 0.9 mg/dL (ref 0.3–1.2)
Total Protein: 7.3 g/dL (ref 6.5–8.1)

## 2022-07-12 LAB — LIPASE, BLOOD: Lipase: 32 U/L (ref 11–51)

## 2022-07-12 LAB — MONONUCLEOSIS SCREEN: Mono Screen: NEGATIVE

## 2022-07-12 LAB — POC URINE PREG, ED: Preg Test, Ur: NEGATIVE

## 2022-07-12 LAB — SARS CORONAVIRUS 2 BY RT PCR: SARS Coronavirus 2 by RT PCR: NEGATIVE

## 2022-07-12 MED ORDER — ACETAMINOPHEN 325 MG PO TABS
650.0000 mg | ORAL_TABLET | Freq: Once | ORAL | Status: AC
  Administered 2022-07-12: 650 mg via ORAL
  Filled 2022-07-12: qty 2

## 2022-07-12 MED ORDER — POTASSIUM CHLORIDE CRYS ER 20 MEQ PO TBCR
40.0000 meq | EXTENDED_RELEASE_TABLET | Freq: Once | ORAL | Status: AC
  Administered 2022-07-12: 40 meq via ORAL
  Filled 2022-07-12: qty 2

## 2022-07-12 MED ORDER — SODIUM CHLORIDE 0.9 % IV BOLUS
1000.0000 mL | Freq: Once | INTRAVENOUS | Status: AC
  Administered 2022-07-12: 1000 mL via INTRAVENOUS

## 2022-07-12 MED ORDER — KETOROLAC TROMETHAMINE 30 MG/ML IJ SOLN
15.0000 mg | Freq: Once | INTRAMUSCULAR | Status: AC
  Administered 2022-07-12: 15 mg via INTRAVENOUS
  Filled 2022-07-12: qty 1

## 2022-07-12 NOTE — ED Notes (Signed)
Called lab to ask about lab work not being resulted, lab unable to locate blood work. EDT Luther Parody states she sent labs down with the labels and sent  urine and covid and blood specimens that the same time.

## 2022-07-12 NOTE — ED Triage Notes (Signed)
C/o body aches since apx 10pm tonight, with headache and neck pain. Also c/o low back pain and possible hematuria.

## 2022-07-12 NOTE — ED Provider Notes (Signed)
[facility] Provider Note    Event Date/Time   First MD Initiated Contact with Patient 07/12/22 [phone]     (approximate)   History   Generalized Body Aches   HPI  Vickie Hayes is a 37 y.o. female who presents to the ED from home with a chief complaint of fever, headaches, body aches, low back pain and possible hematuria since approximately 10 PM.  Denies chest pain, shortness of breath, abdominal pain, nausea, vomiting or diarrhea.     Past Medical History   Past Medical History:  Diagnosis Date   Bacterial vaginosis    Gestational diabetes    pt denies   History of Papanicolaou smear of cervix 03/23/2013;10102017   NEG; NEG   Hypertension    Kidney stone    Medical history non-contributory    Oligomenorrhea    OFF BC     Active Problem List   Patient Active Problem List   Diagnosis Date Noted   Pelvic pressure in female 08/15/2019   Gestational hypertension w/o significant proteinuria in 3rd trimester 03/31/2017   Chronic hypertension affecting pregnancy 03/18/2017   Supervision of high risk pregnancy in third trimester 09/16/2016   Anemia 07/05/2007     Past Surgical History   Past Surgical History:  Procedure Laterality Date   EXTRACORPOREAL SHOCK WAVE LITHOTRIPSY Left 08/16/2019   Procedure: EXTRACORPOREAL SHOCK WAVE LITHOTRIPSY (ESWL);  Surgeon: Vanna Scotland, MD;  Location: [facility] ORS;  Service: Urology;  Laterality: Left;   EXTRACORPOREAL SHOCK WAVE LITHOTRIPSY Left 12/06/2019   Procedure: EXTRACORPOREAL SHOCK WAVE LITHOTRIPSY (ESWL);  Surgeon: Sondra Come, MD;  Location: [facility] ORS;  Service: Urology;  Laterality: Left;   NEXPLANON INSERTION  04/23/2013   NEXPLANON REMOVAL  10/2013   CHARLES DREW   TUBAL LIGATION Bilateral 04/01/2017   Procedure: POST PARTUM TUBAL LIGATION;  Surgeon: Ward, Elenora Fender, MD;  Location: [facility] ORS;  Service: Gynecology;  Laterality: Bilateral;   WISDOM TOOTH EXTRACTION Bilateral      Home  Medications   Prior to Admission medications   Medication Sig Start Date End Date Taking? Authorizing Provider  clotrimazole (CLOTRIMAZOLE-7) 1 % vaginal cream Place 1 Applicatorful vaginally at bedtime. Patient not taking: No sig reported 01/18/20   Matt Holmes, PA  ibuprofen (ADVIL) 800 MG tablet Take 800 mg by mouth every 8 (eight) hours as needed for cramping. Patient not taking: No sig reported    [provider]  metroNIDAZOLE (METROGEL) 0.75 % vaginal gel Place vaginally. Patient not taking: No sig reported 01/11/20   [provider]  NUVARING 0.12-0.015 MG/24HR vaginal ring Insert 1 ring into vagina once a month  leave in place x 3 weeks, then remove x 1 week. Insert a new ring 7 days later. Patient not taking: Reported on 10/01/2020 03/18/20   [provider]  tamsulosin (FLOMAX) 0.4 MG CAPS capsule Take 1 capsule (0.4 mg total) by mouth daily. Patient not taking: No sig reported 12/06/19   Sondra Come, MD     Allergies  Other   Family History   Family History  Problem Relation Age of Onset   HIV Mother    Tuberculosis Mother    Hypertension Mother    Cancer Maternal Grandmother    Hypertension Maternal Grandmother    Diabetes Maternal Grandmother      Physical Exam  Triage Vital Signs: ED Triage Vitals  Enc Vitals Group     BP 07/12/22 0211 131/85     Pulse  Rate 07/12/22 0211 (!) 117     Resp 07/12/22 0211 18     Temp 07/12/22 0211 (!) 101.4 F (38.6 C)     Temp Source 07/12/22 0211 Oral     SpO2 07/12/22 0211 100 %     Weight 07/12/22 0212 193 lb (87.5 kg)     Height 07/12/22 0212 5\' 3"  (1.6 m)     Head Circumference --      Peak Flow --      Pain Score 07/12/22 0212 7     Pain Loc --      Pain Edu? --      Excl. in GC? --     Updated Vital Signs: BP 116/74   Pulse (!) 101   Temp 98.3 F (36.8 C) (Oral)   Resp 18   Ht 5\' 3"  (1.6 m)   Wt 87.5 kg   SpO2 100%   BMI 34.19 kg/m    General: Awake, no distress.   CV:  RRR.  Good peripheral perfusion.  Resp:  Normal effort.  CTAB. Abd:  Nontender.  No distention.  Other:  Mildly dull posterior oropharynx.  No tonsillar swelling, exudates or peritonsillar abscess.  There is no hoarse or muffled voice.  There is no drooling.  No lymphadenopathy.  Neck is supple without meningismus.  No petechiae.   ED Results / Procedures / Treatments  Labs (all labs ordered are listed, but only abnormal results are displayed) Labs Reviewed  URINALYSIS, ROUTINE W REFLEX MICROSCOPIC - Abnormal; Notable for the following components:      Result Value   Color, Urine STRAW (*)    APPearance CLEAR (*)    Specific Gravity, Urine 1.001 (*)    Hgb urine dipstick LARGE (*)    All other components within normal limits  CBC WITH DIFFERENTIAL/PLATELET - Abnormal; Notable for the following components:   WBC 3.8 (*)    RBC 3.63 (*)    Hemoglobin 9.4 (*)    HCT 29.6 (*)    MCH 25.9 (*)    RDW 15.9 (*)    Lymphs Abs 0.3 (*)    All other components within normal limits  SARS CORONAVIRUS 2 BY RT PCR  CBC WITH DIFFERENTIAL/PLATELET  COMPREHENSIVE METABOLIC PANEL  LIPASE, BLOOD  MONONUCLEOSIS SCREEN  POC URINE PREG, ED     EKG  None   RADIOLOGY I have independently visualized and interpreted patient's x-ray as well as noted the radiology interpretation:  X-ray: No acute cardiopulmonary process  Official radiology report(s): DG Chest 2 View  Result Date: 07/12/2022 CLINICAL DATA:  Fever, body aches EXAM: CHEST - 2 VIEW COMPARISON:  06/11/2018 FINDINGS: The heart size and mediastinal contours are within normal limits. Both lungs are clear. The visualized skeletal structures are unremarkable. IMPRESSION: No active cardiopulmonary disease. Electronically Signed   By: Charlett Nose M.D.   On: 07/12/2022 03:00     PROCEDURES:  Critical Care performed: No  Procedures   MEDICATIONS ORDERED IN ED: Medications  acetaminophen (TYLENOL) tablet 650 mg (650 mg Oral  Given 07/12/22 0216)  sodium chloride 0.9 % bolus 1,000 mL (1,000 mLs Intravenous New Bag/Given 07/12/22 0558)  ketorolac (TORADOL) 30 MG/ML injection 15 mg (15 mg Intravenous Given 07/12/22 0558)     IMPRESSION / MDM / ASSESSMENT AND PLAN / ED COURSE  I reviewed the triage vital signs and the nursing notes.  37 year old female presenting with fever, body aches, headache, low back pain and possible hematuria.  Differential diagnosis includes but is not limited to viral process such as COVID-19, influenza, community-acquired pneumonia, UTI, etc.  I have personally reviewed patient's records and note a GYN office visit from December/2022 for vaginal discharge.  Patient's presentation is most consistent with acute illness / injury with system symptoms.  Delay secondary to laboratory department: Unable to find blood, urine or respiratory swab sent 3 hours ago.  POC is negative.  Chest x-ray without pneumonia.  Will administer IV fluids, Ketorolac, check Monospot.  Will reassess.  Clinical Course as of 07/12/22 0709  Mon Jul 12, 2022  0543 Repeat oral temperature 98.3 F. [JS]  0657 Care transferred to Dr. Larinda Buttery at change of shift pending laboratory results. [JS]    Clinical Course User Index [JS] Irean Hong, MD     FINAL CLINICAL IMPRESSION(S) / ED DIAGNOSES   Final diagnoses:  Fever, unspecified fever cause     Rx / DC Orders   ED Discharge Orders     None        Note:  This document was prepared using Dragon voice recognition software and may include unintentional dictation errors.   Irean Hong, MD 07/12/22 [phone]

## 2022-07-12 NOTE — ED Provider Notes (Signed)
-----------------------------------------   7:13 AM on 07/12/2022 -----------------------------------------  Blood pressure 116/74, pulse (!) 101, temperature 98.3 F (36.8 C), temperature source Oral, resp. rate 18, height 5\' 3"  (1.6 m), weight 87.5 kg, SpO2 100 %.  Assuming care from Dr. Dolores Frame.  In short, Vickie Hayes is a 37 y.o. female with a chief complaint of Generalized Body Aches .  Refer to the original H&P for additional details.  The current plan of care is to follow-up labs for generalized body aches, suspect viral illness.  ----------------------------------------- 8:08 AM on 07/12/2022 ----------------------------------------- Temperature and heart rate have improved, patient reports feeling better on reassessment and appears clinically well.  Low suspicion for bacterial sepsis at this time and labs have been reassuring with stable chronic anemia, no significant leukocytosis, electrolyte abnormality, or AKI.  LFTs are unremarkable, mono testing and COVID testing have been negative.  Suspect viral syndrome, no findings concerning for meningitis, and patient is appropriate for discharge home with PCP follow-up.  She was counseled to return to the ED for new or worsening symptoms, patient agrees with plan.    Chesley Noon, MD 07/12/22 [phone]
